# Patient Record
Sex: Male | Born: 1955 | Race: White | Hispanic: No | Marital: Married | Smoking: Former smoker
Health system: Southern US, Community
[De-identification: ages and names within clinical notes are randomized; demographics above are authoritative.]

## PROBLEM LIST (undated history)

## (undated) DIAGNOSIS — F329 Major depressive disorder, single episode, unspecified: Secondary | ICD-10-CM

## (undated) DIAGNOSIS — F32A Depression, unspecified: Secondary | ICD-10-CM

## (undated) DIAGNOSIS — R066 Hiccough: Secondary | ICD-10-CM

## (undated) DIAGNOSIS — F419 Anxiety disorder, unspecified: Secondary | ICD-10-CM

## (undated) DIAGNOSIS — D496 Neoplasm of unspecified behavior of brain: Secondary | ICD-10-CM

## (undated) DIAGNOSIS — D494 Neoplasm of unspecified behavior of bladder: Secondary | ICD-10-CM

## (undated) DIAGNOSIS — D649 Anemia, unspecified: Secondary | ICD-10-CM

## (undated) DIAGNOSIS — Z973 Presence of spectacles and contact lenses: Secondary | ICD-10-CM

## (undated) DIAGNOSIS — M1990 Unspecified osteoarthritis, unspecified site: Secondary | ICD-10-CM

## (undated) DIAGNOSIS — R351 Nocturia: Secondary | ICD-10-CM

## (undated) DIAGNOSIS — C801 Malignant (primary) neoplasm, unspecified: Secondary | ICD-10-CM

## (undated) DIAGNOSIS — Z9289 Personal history of other medical treatment: Secondary | ICD-10-CM

## (undated) HISTORY — PX: BACK SURGERY: SHX140

---

## 1992-03-19 HISTORY — PX: INGUINAL HERNIA REPAIR: SUR1180

## 2012-10-20 ENCOUNTER — Ambulatory Visit: Payer: Self-pay | Admitting: Internal Medicine

## 2013-01-17 HISTORY — PX: LUMBAR DISC SURGERY: SHX700

## 2013-02-09 ENCOUNTER — Other Ambulatory Visit: Payer: Self-pay | Admitting: Urology

## 2013-02-23 ENCOUNTER — Encounter (HOSPITAL_BASED_OUTPATIENT_CLINIC_OR_DEPARTMENT_OTHER): Payer: Self-pay | Admitting: *Deleted

## 2013-02-23 NOTE — Progress Notes (Signed)
NPO AFTER MN. ARRIVE AT 0700. NEEDS HG AND URINE CULTURE.

## 2013-02-24 NOTE — Anesthesia Preprocedure Evaluation (Signed)
Anesthesia Evaluation  Patient identified by MRN, date of birth, ID band Patient awake    Reviewed: Allergy & Precautions, H&P , NPO status , Patient's Chart, lab work & pertinent test results  Airway Mallampati: II TM Distance: >3 FB Neck ROM: Full    Dental no notable dental hx.    Pulmonary neg pulmonary ROS, former smoker,  breath sounds clear to auscultation  Pulmonary exam normal       Cardiovascular negative cardio ROS  Rhythm:Regular Rate:Normal     Neuro/Psych negative neurological ROS  negative psych ROS   GI/Hepatic negative GI ROS, Neg liver ROS,   Endo/Other  negative endocrine ROS  Renal/GU negative Renal ROS  negative genitourinary   Musculoskeletal negative musculoskeletal ROS (+)   Abdominal   Peds negative pediatric ROS (+)  Hematology negative hematology ROS (+)   Anesthesia Other Findings   Reproductive/Obstetrics negative OB ROS                           Anesthesia Physical Anesthesia Plan  ASA: II  Anesthesia Plan: General   Post-op Pain Management:    Induction: Intravenous  Airway Management Planned: LMA  Additional Equipment:   Intra-op Plan:   Post-operative Plan:   Informed Consent: I have reviewed the patients History and Physical, chart, labs and discussed the procedure including the risks, benefits and alternatives for the proposed anesthesia with the patient or authorized representative who has indicated his/her understanding and acceptance.   Dental advisory given  Plan Discussed with: CRNA and Surgeon  Anesthesia Plan Comments:         Anesthesia Quick Evaluation  

## 2013-02-25 ENCOUNTER — Encounter (HOSPITAL_BASED_OUTPATIENT_CLINIC_OR_DEPARTMENT_OTHER): Payer: Self-pay | Admitting: *Deleted

## 2013-02-25 ENCOUNTER — Ambulatory Visit (HOSPITAL_BASED_OUTPATIENT_CLINIC_OR_DEPARTMENT_OTHER)
Admission: RE | Admit: 2013-02-25 | Discharge: 2013-02-25 | Disposition: A | Discharge status: Home / Self Care | Payer: BC Managed Care – PPO | Source: Ambulatory Visit | Attending: Urology | Admitting: Urology

## 2013-02-25 ENCOUNTER — Encounter (HOSPITAL_BASED_OUTPATIENT_CLINIC_OR_DEPARTMENT_OTHER)
Admission: RE | Disposition: A | Discharge status: Home / Self Care | Payer: Self-pay | Source: Ambulatory Visit | Attending: Urology

## 2013-02-25 ENCOUNTER — Ambulatory Visit (HOSPITAL_BASED_OUTPATIENT_CLINIC_OR_DEPARTMENT_OTHER): Payer: BC Managed Care – PPO | Admitting: Anesthesiology

## 2013-02-25 ENCOUNTER — Encounter (HOSPITAL_BASED_OUTPATIENT_CLINIC_OR_DEPARTMENT_OTHER): Payer: BC Managed Care – PPO | Admitting: Anesthesiology

## 2013-02-25 DIAGNOSIS — Z7982 Long term (current) use of aspirin: Secondary | ICD-10-CM | POA: Insufficient documentation

## 2013-02-25 DIAGNOSIS — C679 Malignant neoplasm of bladder, unspecified: Secondary | ICD-10-CM | POA: Insufficient documentation

## 2013-02-25 DIAGNOSIS — E7800 Pure hypercholesterolemia, unspecified: Secondary | ICD-10-CM | POA: Insufficient documentation

## 2013-02-25 DIAGNOSIS — Z79899 Other long term (current) drug therapy: Secondary | ICD-10-CM | POA: Insufficient documentation

## 2013-02-25 DIAGNOSIS — Z87891 Personal history of nicotine dependence: Secondary | ICD-10-CM | POA: Insufficient documentation

## 2013-02-25 HISTORY — PX: CYSTOSCOPY W/ RETROGRADES: SHX1426

## 2013-02-25 HISTORY — PX: TRANSURETHRAL RESECTION OF BLADDER TUMOR: SHX2575

## 2013-02-25 HISTORY — DX: Presence of spectacles and contact lenses: Z97.3

## 2013-02-25 HISTORY — DX: Nocturia: R35.1

## 2013-02-25 HISTORY — DX: Neoplasm of unspecified behavior of bladder: D49.4

## 2013-02-25 HISTORY — DX: Unspecified osteoarthritis, unspecified site: M19.90

## 2013-02-25 SURGERY — TURBT (TRANSURETHRAL RESECTION OF BLADDER TUMOR)
Anesthesia: General

## 2013-02-25 MED ORDER — ACETAMINOPHEN 10 MG/ML IV SOLN
INTRAVENOUS | Status: DC | PRN
  Administered 2013-02-25: 1000 mg via INTRAVENOUS

## 2013-02-25 MED ORDER — DOCUSATE SODIUM 100 MG PO CAPS: 100.0000 mg | ORAL_CAPSULE | Freq: Two times a day (BID) | ORAL | Status: DC | PRN

## 2013-02-25 MED ORDER — LIDOCAINE HCL 1 % IJ SOLN
INTRAMUSCULAR | Status: AC
  Filled 2013-02-25: qty 20

## 2013-02-25 MED ORDER — HYDROCODONE-ACETAMINOPHEN 5-325 MG PO TABS: 1.0000 | ORAL_TABLET | Freq: Four times a day (QID) | ORAL | Status: DC | PRN

## 2013-02-25 MED ORDER — FENTANYL CITRATE 0.05 MG/ML IJ SOLN
INTRAMUSCULAR | Status: AC
  Filled 2013-02-25: qty 4

## 2013-02-25 MED ORDER — CIPROFLOXACIN IN D5W 400 MG/200ML IV SOLN
INTRAVENOUS | Status: AC
  Filled 2013-02-25: qty 200

## 2013-02-25 MED ORDER — ONDANSETRON HCL 4 MG/2ML IJ SOLN
INTRAMUSCULAR | Status: DC | PRN
  Administered 2013-02-25: 4 mg via INTRAVENOUS

## 2013-02-25 MED ORDER — LIDOCAINE HCL 2 % EX GEL
CUTANEOUS | Status: DC | PRN
  Administered 2013-02-25: 1 via URETHRAL

## 2013-02-25 MED ORDER — LACTATED RINGERS IV SOLN
INTRAVENOUS | Status: DC
  Administered 2013-02-25 (×2): via INTRAVENOUS
  Filled 2013-02-25: qty 1000

## 2013-02-25 MED ORDER — DEXAMETHASONE SODIUM PHOSPHATE 4 MG/ML IJ SOLN
INTRAMUSCULAR | Status: DC | PRN
  Administered 2013-02-25: 10 mg via INTRAVENOUS

## 2013-02-25 MED ORDER — MIDAZOLAM HCL 2 MG/2ML IJ SOLN
INTRAMUSCULAR | Status: AC
  Filled 2013-02-25: qty 2

## 2013-02-25 MED ORDER — SODIUM CHLORIDE 0.9 % IR SOLN
Status: DC | PRN
  Administered 2013-02-25: 21000 mL via INTRAVESICAL

## 2013-02-25 MED ORDER — PROPOFOL 10 MG/ML IV BOLUS
INTRAVENOUS | Status: DC | PRN
  Administered 2013-02-25: 200 mg via INTRAVENOUS
  Administered 2013-02-25: 50 mg via INTRAVENOUS

## 2013-02-25 MED ORDER — CIPROFLOXACIN IN D5W 400 MG/200ML IV SOLN
400.0000 mg | INTRAVENOUS | Status: AC
  Administered 2013-02-25: 400 mg via INTRAVENOUS
  Filled 2013-02-25: qty 200

## 2013-02-25 MED ORDER — PROMETHAZINE HCL 25 MG/ML IJ SOLN
6.2500 mg | INTRAMUSCULAR | Status: DC | PRN
  Filled 2013-02-25: qty 1

## 2013-02-25 MED ORDER — IOHEXOL 350 MG/ML SOLN
INTRAVENOUS | Status: DC | PRN
  Administered 2013-02-25: 20 mL via URETHRAL

## 2013-02-25 MED ORDER — HYDROMORPHONE HCL PF 1 MG/ML IJ SOLN
0.2500 mg | INTRAMUSCULAR | Status: DC | PRN
  Filled 2013-02-25: qty 1

## 2013-02-25 MED ORDER — FENTANYL CITRATE 0.05 MG/ML IJ SOLN
INTRAMUSCULAR | Status: DC | PRN
  Administered 2013-02-25 (×4): 50 ug via INTRAVENOUS

## 2013-02-25 MED ORDER — MITOMYCIN CHEMO FOR BLADDER INSTILLATION 40 MG
40.0000 mg | Freq: Once | INTRAVENOUS | Status: DC
  Filled 2013-02-25: qty 40

## 2013-02-25 MED ORDER — KETOROLAC TROMETHAMINE 30 MG/ML IJ SOLN
15.0000 mg | Freq: Once | INTRAMUSCULAR | Status: AC | PRN
  Filled 2013-02-25: qty 1

## 2013-02-25 MED ORDER — LIDOCAINE HCL (CARDIAC) 20 MG/ML IV SOLN
INTRAVENOUS | Status: DC | PRN
  Administered 2013-02-25: 80 mg via INTRAVENOUS

## 2013-02-25 MED ORDER — MIDAZOLAM HCL 5 MG/5ML IJ SOLN
INTRAMUSCULAR | Status: DC | PRN
  Administered 2013-02-25: 2 mg via INTRAVENOUS

## 2013-02-25 SURGICAL SUPPLY — 39 items
ADAPTER CATH URET PLST 4-6FR (CATHETERS) IMPLANT
BAG DRAIN URO-CYSTO SKYTR STRL (DRAIN) ×3 IMPLANT
BAG URINE DRAINAGE (UROLOGICAL SUPPLIES) ×3 IMPLANT
BAG URO CATCHER STRL LF (DRAPE) ×3 IMPLANT
BASKET ZERO TIP NITINOL 2.4FR (BASKET) IMPLANT
BLADE SURG 15 STRL LF DISP TIS (BLADE) IMPLANT
BLADE SURG 15 STRL SS (BLADE)
CANISTER SUCT LVC 12 LTR MEDI- (MISCELLANEOUS) ×3 IMPLANT
CATH FOLEY 2WAY SLVR  5CC 18FR (CATHETERS) ×1
CATH FOLEY 2WAY SLVR 5CC 18FR (CATHETERS) ×2 IMPLANT
CATH FOLEY 3WAY 30CC 22FR (CATHETERS) IMPLANT
CATH INTERMIT  6FR 70CM (CATHETERS) IMPLANT
CATH URET 5FR 28IN CONE TIP (BALLOONS)
CATH URET 5FR 28IN OPEN ENDED (CATHETERS) ×3 IMPLANT
CATH URET 5FR 70CM CONE TIP (BALLOONS) IMPLANT
CATH URET DUAL LUMEN 6-10FR 50 (CATHETERS) IMPLANT
CLOTH BEACON ORANGE TIMEOUT ST (SAFETY) ×3 IMPLANT
DRAPE CAMERA CLOSED 9X96 (DRAPES) ×3 IMPLANT
DRSG TEGADERM 2-3/8X2-3/4 SM (GAUZE/BANDAGES/DRESSINGS) IMPLANT
ELECT LOOP MED HF 24F 12D CBL (CLIP) ×3 IMPLANT
ELECT REM PT RETURN 9FT ADLT (ELECTROSURGICAL)
ELECTRODE REM PT RTRN 9FT ADLT (ELECTROSURGICAL) IMPLANT
EVACUATOR MICROVAS BLADDER (UROLOGICAL SUPPLIES) IMPLANT
GLOVE BIO SURGEON STRL SZ7.5 (GLOVE) ×3 IMPLANT
GLOVE BIO SURGEON STRL SZ8 (GLOVE) ×3 IMPLANT
GOWN PREVENTION PLUS XLARGE (GOWN DISPOSABLE) ×3 IMPLANT
GOWN STRL REIN XL XLG (GOWN DISPOSABLE) ×3 IMPLANT
GUIDEWIRE 0.038 PTFE COATED (WIRE) IMPLANT
GUIDEWIRE ANG ZIPWIRE 038X150 (WIRE) ×3 IMPLANT
GUIDEWIRE STR DUAL SENSOR (WIRE) IMPLANT
HOLDER FOLEY CATH W/STRAP (MISCELLANEOUS) IMPLANT
IV NS IRRIG 3000ML ARTHROMATIC (IV SOLUTION) ×12 IMPLANT
KIT ASPIRATION TUBING (SET/KITS/TRAYS/PACK) ×3 IMPLANT
NS IRRIG 500ML POUR BTL (IV SOLUTION) IMPLANT
PACK CYSTOSCOPY (CUSTOM PROCEDURE TRAY) ×3 IMPLANT
SUT ETHILON 3 0 PS 1 (SUTURE) IMPLANT
SYR 30ML LL (SYRINGE) ×3 IMPLANT
SYRINGE IRR TOOMEY STRL 70CC (SYRINGE) ×3 IMPLANT
TUBE FEEDING 8FR 16IN STR KANG (MISCELLANEOUS) IMPLANT

## 2013-02-25 NOTE — Op Note (Signed)
Preoperative diagnosis:  1. Low-grade appearing papillary lesion on the posterior wall the bladder   Postoperative diagnosis:  1. 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 TURBT 2-5 cm 2. Bilateral retrograde pyelograms with interpretation 3. Exam under anesthesia  Surgeon: Crist Fat, MD  Anesthesia: General  Complications: None  Intraoperative findings: Patient has a large obstructing prostate with a high median bar, UOs were nearly vertical on the posterior side of the median lobe of the prostate, there were no filling defects noted in either ureter or renal pelvis bilaterally. Normal retrograde pyelograms. There were 2 tumors on a broad base the posterior aspect of the bladder each measuring 1-2 cm. Resection was difficult due to the size of the patient's bladder, the scope was was nearly not long enough to get to the lesion. The resection was performed with a half filled bladder. Given concern for invasion into the lamina propria and the high-grade appearance of the tumor no mitomycin was given. Exam under anesthesia revealed a mobile bladder with no palpable mass. Prostate was enlarged but symmetric and smooth.  EBL: Minimal  Specimens: Bladder tumor, both lesions were sent together as one specimen. Base of bladder tumor was sent as a separate specimen. These were sent to the pathology lab for permanent specimens  Indication: Erik Gross is a 57 y.o. patient with microscopic hematuria and dysuria. Workup ultimately revealed a bladder tumor.  After reviewing the management options for treatment, he elected to proceed with the above surgical procedure(s). We have discussed the potential benefits and risks of the procedure, side effects of the proposed treatment, the likelihood of the patient achieving the goals of the procedure, and any potential problems that might occur  during the procedure or recuperation. Informed consent has been obtained.  Description of procedure:  The patient was taken to the operating room and general anesthesia was induced.  The patient was placed in the dorsal lithotomy position, prepped and draped in the usual sterile fashion, and preoperative antibiotics were administered. A preoperative time-out was performed.   A 12.5 22 French rigid cystoscope was then gently passed to the patient's urethra and into the bladder under visual guidance. The scope was then exchanged for the 70 lens and a 360 cystoscopic evaluation was performed with the above findings. The scope was then removed and the 26 French resectoscope sheath was passed through the bladder using a 30 lens with the visual obturator. The obturator was then removed and exchanged for the bipolar resectoscope loop. The lesions were then systematically resected in the usual fashion. Again, this was difficult given the location of the tumor being far posterior incise the patient's bladder. Significant suprapubic pressure was required to facilitate the resection. Once both lesions had been fully resected the tumor was evacuated using a Ellik syringe. The base of the tumors were then  resected and sent as separate specimen. Hemostasis was then achieved.  The resectoscope was then removed and the the 22 French cystoscope was then gently repassed through the urethra and into the bladder. Using a 5 [place] open-ended ureteral Pollock catheter bilateral retrograde pyelogram were performed in the usual fashion. The findings are as above. The bladder was then emptied and the cystoscope removed.  A bimanual exam was then performed with the above findings.  An 45 French Foley catheter was then gently passed to the patient's urethra and into the bladder. 10  cc of sterile water was inserted the balloon. Blood-tinged effluent was noted, the urine was pink.  Incision of this point not to give  mitomycin was made given the high-grade appearance of the patient's tumor and the depth of the resection. The patient was subsequently awoken and returned to PACU in stable condition.  Disposition: The patient will recover the PACU, the Foley catheter be removed. The patient will then be discharged home.  Crist Fat, M.D.

## 2013-02-25 NOTE — Transfer of Care (Signed)
Immediate Anesthesia Transfer of Care Note  Patient: Erik Gross  Procedure(s) Performed: Procedure(s): TRANSURETHRAL RESECTION OF BLADDER TUMOR (TURBT) WITH POST OP MITOMYCIN-C (N/A) BILATERAL RETROGRADE PYELOGRAM (Bilateral)  Patient Location: PACU  Anesthesia Type:General  Level of Consciousness: awake, alert  and oriented  Airway & Oxygen Therapy: Patient Spontanous Breathing and Patient connected to nasal cannula oxygen  Post-op Assessment: Report given to PACU RN  Post vital signs: Reviewed and stable  Complications: No apparent anesthesia complications

## 2013-02-25 NOTE — Anesthesia Postprocedure Evaluation (Signed)
  Anesthesia Post-op Note  Patient: Erik Gross  Procedure(s) Performed: Procedure(s) (LRB): TRANSURETHRAL RESECTION OF BLADDER TUMOR (TURBT) WITH POST OP MITOMYCIN-C (N/A) BILATERAL RETROGRADE PYELOGRAM (Bilateral)  Patient Location: PACU  Anesthesia Type: General  Level of Consciousness: awake and alert   Airway and Oxygen Therapy: Patient Spontanous Breathing  Post-op Pain: mild  Post-op Assessment: Post-op Vital signs reviewed, Patient's Cardiovascular Status Stable, Respiratory Function Stable, Patent Airway and No signs of Nausea or vomiting  Last Vitals:  Filed Vitals:   02/25/13 1016  BP: 115/85  Pulse: 88  Temp: 36.5 C  Resp: 16    Post-op Vital Signs: stable   Complications: No apparent anesthesia complications

## 2013-02-25 NOTE — Anesthesia Procedure Notes (Signed)
Procedure Name: LMA Insertion Date/Time: 02/25/2013 8:37 AM Performed by: Maris Berger T Pre-anesthesia Checklist: Patient identified, Emergency Drugs available, Suction available and Patient being monitored Patient Re-evaluated:Patient Re-evaluated prior to inductionOxygen Delivery Method: Circle System Utilized Preoxygenation: Pre-oxygenation with 100% oxygen Intubation Type: IV induction Ventilation: Mask ventilation without difficulty LMA: LMA inserted LMA Size: 5.0 Number of attempts: 1 Airway Equipment and Method: bite block Placement Confirmation: positive ETCO2 Dental Injury: Teeth and Oropharynx as per pre-operative assessment

## 2013-02-25 NOTE — H&P (Signed)
History of Present Illness This is a 57 year old gentleman who was referred by Dr. Bethann Punches, M.D. for evaluation and management of incomplete emptying and hematuria.  GU HX: The patient states that he has had this problem for several years but it has progressively gotten worse over the last 12 months. The patient states that he often void prior to going to bed and then almost immediately feels the urge to void again. In addition the patient has had gross hematuria since December intermittently. He treats this with apple cider vinegar and baking soda which seems to clear it up. No history of documented UTIs but does have dysuria associated with this gross hematuria. The patient denies any flank pain, fever or suprapubic pain. He does endorse a weak stream and terminal dribbling. He denies urinary urgency, frequency or any urinary incontinence. The patient denies a history of kidney stones.   Was given Flomax, Cipro (although culture was negative), and taught how to double void.  Interval:  Patient returns today for clearance for his upcoming back surgery. Urinalysis yesterday for his preoperative evaluation revealed many red blood cells, white blood cells, and bacteria. The patient states that he seems to be voiding better with the initiation of Flomax, but he still has dysuria. He denies any gross hematuria, but does say that his urine was somewhat darker today.  Patient has no known history of recent viral infections, has no known exposures to organic compounds or industrial solvents,  no history of kidney stones, and has no family history of GU malignancy.   Past Medical History Problems  1. History of Arthritis (V13.4) 2. History of hypercholesterolemia (V12.29)  Surgical History Problems  1. History of Hernia Repair  Current Meds 1. Apple Cider Vinegar CAPS;  Therapy: (Recorded:14Oct2014) to Recorded 2. Aspirin 81 MG Oral Tablet;  Therapy: (Recorded:14Oct2014) to Recorded 3.  Ciprofloxacin HCl - 500 MG Oral Tablet; TAKE 1 TABLET Every twelve hours;  Therapy: 14Oct2014 to (Evaluate:21Oct2014)  Requested for: 14Oct2014; Last  Rx:14Oct2014 Ordered 4. Multi-Vitamin TABS;  Therapy: (Recorded:14Oct2014) to Recorded 5. Tamsulosin HCl - 0.4 MG Oral Capsule; TAKE 1 CAPSULE Daily;  Therapy: 14Oct2014 to (Evaluate:12May2015)  Requested for: 14Oct2014; Last  Rx:14Oct2014 Ordered  Allergies Medication  1. No Known Drug Allergies  Family History Problems  1. Family history of Death In The Family Father : Mother   age 28 heart attack 2. Family history of Death In The Family Mother : Mother   age 5 lung cancer 3. Family history of Family Health Status Number Of Children : Mother   1 son 1 daughter 4. Family history of Heart Disease (V17.49) : Father 5. Family history of Lung Cancer (V16.1) : Mother  Social History Problems  1. Denied: History of Alcohol Use 2. Caffeine Use   6-8 per day 3. Former smoker (V15.82)   2 ppd for 32 years quit since 2007 4. Marital History - Currently Married 5. Occupation:   supply room clerk  Review of Systems No changes in the patient's neurologic status, or change in bowel habits. He denies any weight loss, night sweats, or fatigue. He has ongoing back pain.   Vitals Vital Signs [Data Includes: Last 1 Day]  Recorded: 04Nov2014 08:34AM  Blood Pressure: 126 / 76 Temperature: 97.6 F Heart Rate: 92  Physical Exam Constitutional: Well nourished and well developed . No acute distress.  ENT:. The ears and nose are normal in appearance.  Neck: The appearance of the neck is normal and no neck mass is  present.  Abdomen: The abdomen is soft and nontender. No masses are palpated. No CVA tenderness. No hernias are palpable. No hepatosplenomegaly noted.  Genitourinary: Examination of the penis demonstrates no discharge, no masses, no lesions and a normal meatus. The scrotum is without lesions. The right epididymis is palpably  normal and non-tender. The left epididymis is palpably normal and non-tender. The right testis is non-tender and without masses. The left testis is non-tender and without masses.  Skin: Normal skin turgor, no visible rash and no visible skin lesions.  Neuro/Psych:. Mood and affect are appropriate.    Results/Data Urine [Data Includes: Last 1 Day]   04Nov2014  COLOR AMBER   APPEARANCE CLOUDY   SPECIFIC GRAVITY 1.010   pH 7.5   GLUCOSE NEG mg/dL  BILIRUBIN NEG   KETONE NEG mg/dL  BLOOD LARGE   PROTEIN TRACE mg/dL  UROBILINOGEN 0.2 mg/dL  NITRITE NEG   LEUKOCYTE ESTERASE NEG   SQUAMOUS EPITHELIAL/HPF RARE   WBC 0-2 WBC/hpf  RBC TNTC RBC/hpf  BACTERIA RARE   CRYSTALS NONE SEEN   CASTS NONE SEEN    Procedure  Procedure: Cystoscopy  Chaperone Present: amanda.  Indication: Hematuria.  Informed Consent: Risks, benefits, and potential adverse events were discussed and informed consent was obtained from the patient.  Prep: The patient was prepped with hibiclens.  Antibiotic prophylaxis: Ciprofloxacin.  Procedure Note:  Urethral meatus:. No abnormalities.  Anterior urethra: No abnormalities.  Prostatic urethra: No abnormalities.  Bladder: Visulization was obscured due to cloudy urine. The ureteral orifices were in the normal anatomic position bilaterally. A solitary tumor was visualized in the bladder. This tumor was located on the right side, on the posterior aspect, at the base of the bladder. The patient tolerated the procedure well.  Complications: None.    Assessment Microscopic hematuria most likely secondary to the low-grade appearing less than 2 cm bladder lesion however, to be thorough will also evaluate the kidneys and ureters via CT scan..   End of Encounter Meds  Medication Name Instruction  Apple Cider Vinegar CAPS   Aspirin 81 MG Oral Tablet   Multi-Vitamin TABS   Tamsulosin HCl - 0.4 MG Oral Capsule TAKE 1 CAPSULE Daily   Plan  Bladder mass  1. AU  CT-HEMATURIA PROTOCOL; Status:Hold For - Appointment,PreCert,Date of  Service,Print; Requested for:04Nov2014;  2. BASIC METABOLIC PANEL; Status:In Progress - Specimen/Data Collected;   Done:  04Nov2014 3. Cysto; Status:Complete;   Done: 04Nov2014 4. VENIPUNCTURE; Status:Complete;   Done: 04Nov2014 5. Follow-up Schedule Surgery Office  Follow-up  Status: Complete  Done: 04Nov2014 Health Maintenance  6. UA With REFLEX; Status:Complete;   Done: 04Nov2014 08:23AM  URINE CULTURE; Status:In Progress - Specimen/Data Collected;  Done: 04Nov2014 Perform:[facility]; Due:06Nov2014; Marked Important; Last Updated ZO:[id number], Marthenia Rolling; 01/20/2013 10:12:27 AM;Ordered; Today;  WUJ:[id number] mass; Ordered WG:[id number], Sharlet [place];   Discussion/Summary Prior to undergoing cystoscopy, I did call Dr. Lorelee Market office and discussed his microscopic hematuria with his nurse. There is no evidence of infection in his urine today however the hematuria was concerning and I felt that this needed to be evaluated. However, the patient was interested in having his back surgery first and then evaluating microscopic hematuria. After conferring with Dr. Clotilde Dieter, the decision was made to proceed with the hematuria evaluation. This did reveal small mass in the posterior wall of the patient's bladder. I have scheduled a transurethral resection of this bladder tumor, and we will plan to give the patient postoperative mitomycin C in the PACU. I suspect the patient will be  ready for his back surgery in 4 weeks.    Addendum: Heart sounds normal, regular rate/rhythm Lungs sounds clear bilaterally  No changes to above. Proceed with TURBT, b/l retrograde pyelograms, post-operative [facility].

## 2013-02-26 ENCOUNTER — Encounter (HOSPITAL_BASED_OUTPATIENT_CLINIC_OR_DEPARTMENT_OTHER): Payer: Self-pay | Admitting: Urology

## 2013-02-26 LAB — URINE CULTURE

## 2013-03-10 ENCOUNTER — Telehealth: Payer: Self-pay | Admitting: Oncology

## 2013-03-10 NOTE — Telephone Encounter (Signed)
LVOM FOR PT TO RETURN CALL IN RE TO REFERRAL.  °

## 2013-03-11 ENCOUNTER — Telehealth: Payer: Self-pay | Admitting: Oncology

## 2013-03-11 NOTE — Telephone Encounter (Signed)
C/D 03/11/13  For appt. 03/18/13

## 2013-03-11 NOTE — Telephone Encounter (Signed)
SW PT AND GVE NP APPT 12/31 @ 1:30 W/DR. SHADAD REFERRING DR. HERRICK DX-BLADDER CA WELCOME PACKET MAILED.

## 2013-03-11 NOTE — Telephone Encounter (Signed)
S/W PT AND GVE NP APPT 12/26 @ 12:30 W/DR. LIVESAY REFERRING DR. MATT MCDONALD WELCOME PACKET MAILED.

## 2013-03-11 NOTE — Telephone Encounter (Signed)
**  ERROR IN 1ST MESSAGE**

## 2013-03-18 ENCOUNTER — Other Ambulatory Visit: Payer: BC Managed Care – PPO

## 2013-03-18 ENCOUNTER — Ambulatory Visit: Payer: BC Managed Care – PPO | Admitting: Oncology

## 2013-03-18 ENCOUNTER — Ambulatory Visit: Payer: BC Managed Care – PPO

## 2013-12-14 ENCOUNTER — Inpatient Hospital Stay (HOSPITAL_COMMUNITY)
Admission: EM | Admit: 2013-12-14 | Discharge: 2013-12-17 | DRG: 669 | Disposition: A | Discharge status: Home / Self Care | Payer: BC Managed Care – PPO | Attending: Internal Medicine | Admitting: Internal Medicine

## 2013-12-14 ENCOUNTER — Emergency Department (HOSPITAL_COMMUNITY): Payer: BC Managed Care – PPO

## 2013-12-14 ENCOUNTER — Encounter (HOSPITAL_COMMUNITY): Payer: Self-pay | Admitting: Emergency Medicine

## 2013-12-14 DIAGNOSIS — E785 Hyperlipidemia, unspecified: Secondary | ICD-10-CM | POA: Diagnosis present

## 2013-12-14 DIAGNOSIS — Z8551 Personal history of malignant neoplasm of bladder: Secondary | ICD-10-CM

## 2013-12-14 DIAGNOSIS — Z7982 Long term (current) use of aspirin: Secondary | ICD-10-CM

## 2013-12-14 DIAGNOSIS — C672 Malignant neoplasm of lateral wall of bladder: Principal | ICD-10-CM | POA: Diagnosis present

## 2013-12-14 DIAGNOSIS — Z66 Do not resuscitate: Secondary | ICD-10-CM | POA: Diagnosis present

## 2013-12-14 DIAGNOSIS — D494 Neoplasm of unspecified behavior of bladder: Secondary | ICD-10-CM

## 2013-12-14 DIAGNOSIS — Z79899 Other long term (current) drug therapy: Secondary | ICD-10-CM | POA: Diagnosis not present

## 2013-12-14 DIAGNOSIS — D62 Acute posthemorrhagic anemia: Secondary | ICD-10-CM | POA: Diagnosis present

## 2013-12-14 DIAGNOSIS — D500 Iron deficiency anemia secondary to blood loss (chronic): Secondary | ICD-10-CM | POA: Diagnosis present

## 2013-12-14 DIAGNOSIS — Z87891 Personal history of nicotine dependence: Secondary | ICD-10-CM | POA: Diagnosis not present

## 2013-12-14 DIAGNOSIS — C675 Malignant neoplasm of bladder neck: Secondary | ICD-10-CM

## 2013-12-14 DIAGNOSIS — R319 Hematuria, unspecified: Secondary | ICD-10-CM | POA: Diagnosis present

## 2013-12-14 DIAGNOSIS — D649 Anemia, unspecified: Secondary | ICD-10-CM | POA: Diagnosis present

## 2013-12-14 HISTORY — DX: Malignant (primary) neoplasm, unspecified: C80.1

## 2013-12-14 LAB — ABO/RH: ABO/RH(D): O POS

## 2013-12-14 LAB — CBC
HCT: 18.3 % — ABNORMAL LOW (ref 39.0–52.0)
Hemoglobin: 5.5 g/dL — CL (ref 13.0–17.0)
MCH: 22.4 pg — ABNORMAL LOW (ref 26.0–34.0)
MCHC: 30.1 g/dL (ref 30.0–36.0)
MCV: 74.4 fL — AB (ref 78.0–100.0)
PLATELETS: 282 10*3/uL (ref 150–400)
RBC: 2.46 MIL/uL — AB (ref 4.22–5.81)
RDW: 18.8 % — ABNORMAL HIGH (ref 11.5–15.5)
WBC: 7.7 10*3/uL (ref 4.0–10.5)

## 2013-12-14 LAB — URINALYSIS, ROUTINE W REFLEX MICROSCOPIC
BILIRUBIN URINE: NEGATIVE
GLUCOSE, UA: NEGATIVE mg/dL
Ketones, ur: 15 mg/dL — AB
NITRITE: NEGATIVE
PH: 5.5 (ref 5.0–8.0)
Protein, ur: >300 mg/dL — AB
SPECIFIC GRAVITY, URINE: 1.027 (ref 1.005–1.030)
Urobilinogen, UA: 1 mg/dL (ref 0.0–1.0)

## 2013-12-14 LAB — COMPREHENSIVE METABOLIC PANEL
ALT: 8 U/L (ref 0–53)
AST: 12 U/L (ref 0–37)
Albumin: 3.4 g/dL — ABNORMAL LOW (ref 3.5–5.2)
Alkaline Phosphatase: 53 U/L (ref 39–117)
Anion gap: 13 (ref 5–15)
BUN: 12 mg/dL (ref 6–23)
CO2: 23 meq/L (ref 19–32)
CREATININE: 1.11 mg/dL (ref 0.50–1.35)
Calcium: 8.5 mg/dL (ref 8.4–10.5)
Chloride: 100 mEq/L (ref 96–112)
GFR calc Af Amer: 82 mL/min — ABNORMAL LOW (ref >90)
GFR, EST NON AFRICAN AMERICAN: 70 mL/min — AB (ref >90)
Glucose, Bld: 99 mg/dL (ref 70–99)
Potassium: 3.7 mEq/L (ref 3.7–5.3)
Sodium: 136 mEq/L — ABNORMAL LOW (ref 137–147)
TOTAL PROTEIN: 6.7 g/dL (ref 6.0–8.3)
Total Bilirubin: 0.2 mg/dL — ABNORMAL LOW (ref 0.3–1.2)

## 2013-12-14 LAB — URINE MICROSCOPIC-ADD ON

## 2013-12-14 LAB — PREPARE RBC (CROSSMATCH)

## 2013-12-14 MED ORDER — ONDANSETRON HCL 4 MG PO TABS: 4.0000 mg | ORAL_TABLET | Freq: Four times a day (QID) | ORAL | Status: DC | PRN

## 2013-12-14 MED ORDER — DIPHENHYDRAMINE HCL 50 MG/ML IJ SOLN
25.0000 mg | Freq: Once | INTRAMUSCULAR | Status: DC
Start: 2013-12-14 — End: 2013-12-14

## 2013-12-14 MED ORDER — ACETAMINOPHEN 325 MG PO TABS: 650.0000 mg | ORAL_TABLET | Freq: Four times a day (QID) | ORAL | Status: DC | PRN

## 2013-12-14 MED ORDER — IOHEXOL 300 MG/ML  SOLN
80.0000 mL | Freq: Once | INTRAMUSCULAR | Status: AC | PRN
  Administered 2013-12-14: 80 mL via INTRAVENOUS

## 2013-12-14 MED ORDER — ONDANSETRON HCL 4 MG/2ML IJ SOLN: 4.0000 mg | Freq: Four times a day (QID) | INTRAMUSCULAR | Status: DC | PRN

## 2013-12-14 MED ORDER — METOCLOPRAMIDE HCL 5 MG/ML IJ SOLN: 10.0000 mg | Freq: Once | INTRAMUSCULAR | Status: DC

## 2013-12-14 MED ORDER — SODIUM CHLORIDE 0.9 % IV SOLN
Freq: Once | INTRAVENOUS | Status: AC
  Administered 2013-12-14: 17:00:00 via INTRAVENOUS

## 2013-12-14 MED ORDER — ONDANSETRON HCL 4 MG/2ML IJ SOLN: 4.0000 mg | Freq: Three times a day (TID) | INTRAMUSCULAR | Status: DC | PRN

## 2013-12-14 MED ORDER — ACETAMINOPHEN 650 MG RE SUPP: 650.0000 mg | Freq: Four times a day (QID) | RECTAL | Status: DC | PRN

## 2013-12-14 MED ORDER — IOHEXOL 300 MG/ML  SOLN
25.0000 mL | Freq: Once | INTRAMUSCULAR | Status: AC | PRN
  Administered 2013-12-14: 25 mL via ORAL

## 2013-12-14 NOTE — Consult Note (Signed)
Urology Consult   Physician requesting consult: Dr. Noemi Chapel, ED  Reason for consult: Bladder cancer  History of Present Illness: Erik Gross is a 58 y.o. male with known muscle invasive bladder cancer diagnosed in 02/2013 after evaluation for gross hematuria and lower urinary tract symptoms. Retrograde pyelograms were negative at that time. The patient refused the recommendation for definitive treatment despite explaining that this is an invariably a progressive and lethal malignancy. He did not follow up with urology. He continued to have intermittent gross hematuria. He has been having worsening hematuria with clot passage recently over the last month. He has to strain to void due to the clots on occasion but is able to empty his bladder. He was sent to the ER after presenting to his PCP with DOE, weakness and fatigue. Labs showed significant anemia with a Hgb of 5.5.     Past Medical History  Diagnosis Date  . Bladder tumor   . Nocturia   . Arthritis   . Wears glasses   . Cancer     Past Surgical History  Procedure Laterality Date  . Lumbar disc surgery  01-17-2013  . Inguinal hernia repair Right 1994  . Transurethral resection of bladder tumor N/A 02/25/2013    Procedure: TRANSURETHRAL RESECTION OF BLADDER TUMOR (TURBT) WITH POST OP MITOMYCIN-C;  Surgeon: Ardis Hughs, MD;  Location: [facility];  Service: Urology;  Laterality: N/A;  . Cystoscopy w/ retrogrades Bilateral 02/25/2013    Procedure: BILATERAL RETROGRADE PYELOGRAM;  Surgeon: Ardis Hughs, MD;  Location: [facility];  Service: Urology;  Laterality: Bilateral;    Medications:  Home meds:    Medication List    ASK your doctor about these medications       aspirin EC 81 MG tablet  Take by mouth daily.     multivitamin tablet  Take 1 tablet by mouth daily.     TURMERIC PO  Take 1 tablet by mouth daily.        Scheduled Meds:  Continuous Infusions: . sodium  chloride     PRN Meds:.  Allergies: No Known Allergies  No family history on file.  Social History:  reports that he quit smoking about 9 years ago. His smoking use included Cigarettes. He has a 60 pack-year smoking history. He has never used smokeless tobacco. He reports that he does not drink alcohol or use illicit drugs.  ROS: A complete review of systems was performed.  All systems are negative except for pertinent findings as noted.  Physical Exam:  Vital signs in last 24 hours: Temp:  [98.2 F (36.8 C)] 98.2 F (36.8 C) (09/28 1357) Pulse Rate:  [94-108] 96 (09/28 1700) Resp:  [24-28] 28 (09/28 1700) BP: (99-131)/(57-72) 121/72 mmHg (09/28 1700) SpO2:  [99 %-100 %] 100 % (09/28 1700) Weight:  [89.812 kg (198 lb)] 89.812 kg (198 lb) (09/28 1357) Constitutional:  Alert and oriented, No acute distress Cardiovascular: Regular rate and rhythm, No JVD Respiratory: Normal respiratory effort, Lungs clear bilaterally GI: Abdomen is soft, nontender, nondistended, no abdominal masses Genitourinary: No CVAT. Normal male phallus, testes are descended bilaterally and non-tender and without masses, scrotum is normal in appearance without lesions or masses, perineum is normal on inspection. Lymphatic: No lymphadenopathy Neurologic: Grossly intact, no focal deficits Psychiatric: Normal mood and affect  Laboratory Data:   CBC Latest Ref Rng 12/14/2013 02/25/2013  WBC 4.0 - 10.5 K/uL 7.7 -  Hemoglobin 13.0 - 17.0 g/dL 5.5(LL) 14.9  Hematocrit 39.0 - 52.0 % 18.3(L) -  Platelets 150 - 400 K/uL 282 -     Recent Labs  12/14/13 1408  NA 136*  K 3.7  CL 100  GLUCOSE 99  BUN 12  CALCIUM 8.5  CREATININE 1.11    Urinalysis    Component Value Date/Time   COLORURINE RED* 12/14/2013 1511   APPEARANCEUR TURBID* 12/14/2013 1511   LABSPEC 1.027 12/14/2013 1511   PHURINE 5.5 12/14/2013 1511   GLUCOSEU NEGATIVE 12/14/2013 1511   HGBUR LARGE* 12/14/2013 1511   BILIRUBINUR NEGATIVE 12/14/2013  1511   KETONESUR 15* 12/14/2013 1511   PROTEINUR >300* 12/14/2013 1511   UROBILINOGEN 1.0 12/14/2013 1511   NITRITE NEGATIVE 12/14/2013 1511   LEUKOCYTESUR SMALL* 12/14/2013 1511       Radiologic Imaging: Ct Abdomen Pelvis W Contrast  12/14/2013   CLINICAL DATA:  Increasing hematuria over past couple weeks, shortness of breath, weakness, now with hemoglobin of 5 g, history of bladder cancer 1 year ago  EXAM: CT ABDOMEN AND PELVIS WITH CONTRAST  TECHNIQUE: Multidetector CT imaging of the abdomen and pelvis was performed using the standard protocol following bolus administration of intravenous contrast. Sagittal and coronal MPR images reconstructed from axial data set.  CONTRAST:  46mL OMNIPAQUE IOHEXOL 300 MG/ML SOLN IV. Dilute oral contrast.  COMPARISON:  None.  FINDINGS: Lung bases clear.  Multiple hepatic cysts largest 18 x 17 mm.  Tiny calcified granulomata within spleen.  Liver, spleen, pancreas, kidneys and adrenal glands otherwise normal appearance.  No hydronephrosis, ureteral calcification or ureteral dilatation.  Abnormal appearance of the urinary bladder with an enhancing irregular soft tissue mass at the LEFT lateral aspect measuring 5.3 x 4.0 x 5.6 cm in size.  This most likely represents a bladder neoplasm.  Infiltration of adjacent fat planes in prevesical space is concerning for direct tumor extension.  Tumor extends to near the LEFT ureterovesical junction without obstruction/ureteral dilatation.  Dependent intermediate attenuation within the bladder could represent a second tumor nodule or dependent blood clot 2.6 x 1.8 x 1.7 cm.  No definite pelvic adenopathy or perivesical tumor nodularity.  Prostate gland and seminal vesicles grossly unremarkable.  Mild hyperemia in LEFT perivesical soft tissues.  Normal appendix.  Scattered colonic diverticulosis greatest at sigmoid colon.  Stomach and bowel loops otherwise normal appearance.  No additional mass, adenopathy, free fluid, or free air.   Small umbilical hernia containing fat.  No definite osseous metastatic lesions.  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  IMPRESSION: 5.3 x 4.0 x 5.6 cm diameter irregular enhancing mass at the the LEFT lateral aspect of the urinary bladder compatible with bladder neoplasm with probable direct extension of tumor into the prevesical space.  Small amount of blood clot or less likely an additional tumor nodule dependently in bladder.  Bladder tumor extends to near the LEFT ureterovesical junction without causing LEFT hydronephrosis or ureteral dilatation.  No definite adenopathy or metastatic lesions.  Sigmoid diverticulosis without diverticulitis.  Hepatic cystic disease.  Umbilical hernia.  Findings called to Dr. Sabra Heck on 12/14/2013 at 1553 hours.   Electronically Signed   By: Lavonia Dana M.D.   On: 12/14/2013 15:54    I independently reviewed the above imaging studies. Enlarged mass since 02/2013 with likely extension into perivesical fat. No obvious adenopathy or metastatic disease.   Impression/Recommendation 57M with known T2 urothelial cancer of the bladder who previously refused any treatment and did not follow up after  recommendation for neo-adjuvant chemotherapy followed by radical cystectomy in 02/2013. He now presents with profound anemia, continued hematuria with passage of clot, and progression of his tumor (possibly now T3). No clear evidence of metastatic disease at this point, but work up not yet complete.   Discussed the current status of the patient's disease and that the recommended paradigm for what looks to be at least T3 disease would be neoadjuvant platinum based chemotherapy followed by radical cystectomy depending on his response. This is all assuming his chest imaging is negative. Despite refusing this in 02/2013, the patient now seems willing to entertain this idea and would like to speak to a medical oncologist.  Without chemotherapy, it is likely that radical cystectomy would not be curative and subject him to unnecessary harms of surgery without the benefit.   1. Discussed foley catheter given clot passage and intermittent difficulty urinating. Patient is currently refusing this as he is emptying his bladder reasonably well. 2. Agree with medicine admission for treatment of severe anemia. 3. Medical oncology referral 4. Complete staging with CT chest  Urology will follow along.    I have seen and examined the patient, reviewed his history and review of systems, viewed the CT images and reviewed his labs and prior AUS office notes.   I discussed the case with Dr. Sabra Heck, Dr. Wynetta Emery and Dr. Louis Meckel and concur with the assessment and plan.    CC: Dr. Noemi Chapel.

## 2013-12-14 NOTE — H&P (Signed)
[facility] History and Physical  Beni D Garabedian ZOX:[id number] DOB: 1955/10/04 DOA: 12/14/2013  Referring physician: EDP PCP: Rusty Aus., MD   Chief Complaint: fatigue  HPI: Erik Gross is a 58 y.o. male with PMH only significant for Bladder CA, diagnosed in 11/14, he was seen by Dr.Herrick and underwent TURBT in 12/14, subsequently advised to have further surgery and chemotherapy which he refused then and since has not followed up with Urology. Since June he has been having intermittent hematuria which used to clears spontaneously, for the last 3-4 weeks having gross bloody hematuria with clots with intermittent episodes of retention. For last few days with increasing fatigue and progressive Dyspnea on exertion, went to see her PCP found to be profoundly anemic and referred to the ER. In ER, Hb 5.5, gross hematuria and CT with locally advanced bladder CA   Review of Systems: positives bolded Constitutional:  No weight loss, night sweats, Fevers, chills, fatigue.  HEENT:  No headaches, Difficulty swallowing,Tooth/dental problems,Sore throat,  No sneezing, itching, ear ache, nasal congestion, post nasal drip,  Cardio-vascular:  No chest pain, Orthopnea, PND, swelling in lower extremities, anasarca, dizziness, palpitations  GI:  No heartburn, indigestion, abdominal pain, nausea, vomiting, diarrhea, change in bowel habits, loss of appetite  Resp:   shortness of breath with exertion or at rest. No excess mucus, no productive cough, No non-productive cough, No coughing up of blood.No change in color of mucus.No wheezing.No chest wall deformity  Skin:  no rash or lesions.  GU:  no dysuria, change in color of urine, no urgency or frequency. No flank pain.  Musculoskeletal:  No joint pain or swelling. No decreased range of motion. No back pain.  Psych:  No change in mood or affect. No depression or anxiety. No memory loss.   Past Medical History  Diagnosis Date  . Bladder  tumor   . Nocturia   . Arthritis   . Wears glasses   . Cancer    Past Surgical History  Procedure Laterality Date  . Lumbar disc surgery  01-17-2013  . Inguinal hernia repair Right 1994  . Transurethral resection of bladder tumor N/A 02/25/2013    Procedure: TRANSURETHRAL RESECTION OF BLADDER TUMOR (TURBT) WITH POST OP MITOMYCIN-C;  Surgeon: Ardis Hughs, MD;  Location: [facility];  Service: Urology;  Laterality: N/A;  . Cystoscopy w/ retrogrades Bilateral 02/25/2013    Procedure: BILATERAL RETROGRADE PYELOGRAM;  Surgeon: Ardis Hughs, MD;  Location: [facility];  Service: Urology;  Laterality: Bilateral;   Social History:  reports that he quit smoking about 9 years ago. His smoking use included Cigarettes. He has a 60 pack-year smoking history. He has never used smokeless tobacco. He reports that he does not drink alcohol or use illicit drugs.  No Known Allergies  No family history on file.   Prior to Admission medications   Medication Sig Start Date End Date Taking? Authorizing Provider  aspirin EC 81 MG tablet Take by mouth daily.    Yes Historical Provider, MD  Multiple Vitamin (MULTIVITAMIN) tablet Take 1 tablet by mouth daily.   Yes Historical Provider, MD  TURMERIC PO Take 1 tablet by mouth daily.   Yes Historical Provider, MD   Physical Exam: Filed Vitals:   12/14/13 1600 12/14/13 1615 12/14/13 1630 12/14/13 1700  BP: 112/61 131/71 118/67 121/72  Pulse: 96 102 100 96  Temp:      TempSrc:      Resp:    28  Weight:      SpO2: 99% 100% 100% 100%    Wt Readings from Last 3 Encounters:  12/14/13 89.812 kg (198 lb)  02/25/13 93.214 kg (205 lb 8 oz)  02/25/13 93.214 kg (205 lb 8 oz)    General:  Appears calm and comfortable, AAOx3, very pale, obese Eyes: PERRL, normal lids, irises & conjunctiva ENT: grossly normal  lips & tongue Neck: no LAD, masses or thyromegaly Cardiovascular: RRR, no m/r/g. No LE edema. Respiratory: CTA  bilaterally, no w/r/r. Normal respiratory effort. Abdomen: soft, ntnd, obese, BS present Skin: no rash or induration seen on limited exam Musculoskeletal: grossly normal tone BUE/BLE, trace edema  Psychiatric: grossly normal mood and affect, speech fluent and appropriate Neurologic: grossly non-focal.          Labs on Admission:  Basic Metabolic Panel:  Recent Labs Lab 12/14/13 1408  NA 136*  K 3.7  CL 100  CO2 23  GLUCOSE 99  BUN 12  CREATININE 1.11  CALCIUM 8.5   Liver Function Tests:  Recent Labs Lab 12/14/13 1408  AST 12  ALT 8  ALKPHOS 53  BILITOT 0.2*  PROT 6.7  ALBUMIN 3.4*   No results found for this basename: LIPASE, AMYLASE,  in the last 168 hours No results found for this basename: AMMONIA,  in the last 168 hours CBC:  Recent Labs Lab 12/14/13 1408  WBC 7.7  HGB 5.5*  HCT 18.3*  MCV 74.4*  PLT 282   Cardiac Enzymes: No results found for this basename: CKTOTAL, CKMB, CKMBINDEX, TROPONINI,  in the last 168 hours  BNP (last 3 results) No results found for this basename: PROBNP,  in the last 8760 hours CBG: No results found for this basename: GLUCAP,  in the last 168 hours  Radiological Exams on Admission: Ct Abdomen Pelvis W Contrast  12/14/2013   CLINICAL DATA:  Increasing hematuria over past couple weeks, shortness of breath, weakness, now with hemoglobin of 5 g, history of bladder cancer 1 year ago  EXAM: CT ABDOMEN AND PELVIS WITH CONTRAST  TECHNIQUE: Multidetector CT imaging of the abdomen and pelvis was performed using the standard protocol following bolus administration of intravenous contrast. Sagittal and coronal MPR images reconstructed from axial data set.  CONTRAST:  3mL OMNIPAQUE IOHEXOL 300 MG/ML SOLN IV. Dilute oral contrast.  COMPARISON:  None.  FINDINGS: Lung bases clear.  Multiple hepatic cysts largest 18 x 17 mm.  Tiny calcified granulomata within spleen.  Liver, spleen, pancreas, kidneys and adrenal glands otherwise normal  appearance.  No hydronephrosis, ureteral calcification or ureteral dilatation.  Abnormal appearance of the urinary bladder with an enhancing irregular soft tissue mass at the LEFT lateral aspect measuring 5.3 x 4.0 x 5.6 cm in size.  This most likely represents a bladder neoplasm.  Infiltration of adjacent fat planes in prevesical space is concerning for direct tumor extension.  Tumor extends to near the LEFT ureterovesical junction without obstruction/ureteral dilatation.  Dependent intermediate attenuation within the bladder could represent a second tumor nodule or dependent blood clot 2.6 x 1.8 x 1.7 cm.  No definite pelvic adenopathy or perivesical tumor nodularity.  Prostate gland and seminal vesicles grossly unremarkable.  Mild hyperemia in LEFT perivesical soft tissues.  Normal appendix.  Scattered colonic diverticulosis greatest at sigmoid colon.  Stomach and bowel loops otherwise normal appearance.  No additional mass, adenopathy, free fluid, or free air.  Small umbilical hernia containing fat.  No definite osseous metastatic lesions.  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  IMPRESSION: 5.3 x 4.0 x 5.6 cm diameter irregular enhancing mass at the the LEFT lateral aspect of the urinary bladder compatible with bladder neoplasm with probable direct extension of tumor into the prevesical space.  Small amount of blood clot or less likely an additional tumor nodule dependently in bladder.  Bladder tumor extends to near the LEFT ureterovesical junction without causing LEFT hydronephrosis or ureteral dilatation.  No definite adenopathy or metastatic lesions.  Sigmoid diverticulosis without diverticulitis.  Hepatic cystic disease.  Umbilical hernia.  Findings called to Dr. Sabra Heck on 12/14/2013 at 1553 hours.   Electronically Signed   By: Lavonia Dana M.D.   On: 12/14/2013 15:54    Assessment/Plan Active Problems:   Anemia-due to acute on  chronic blood loss -gross hematuria with clots off and on for months -transfuse 2units PRBC, will need more -check anemia panel for Iron stores    Hematuria/ recurrent Bladder cancer -s/p TURBT in 12/14 -then declined further surgery and Chemo as recommended -Stop ASA -Urology Consulted, Dr.Wrenn, will see at [facility]   h/o Dyslipidemia -not on meds now  Code Status: DNR DVT Prophylaxis: SCDs Family Communication: d/w wife at bedside Disposition Plan: transfer to [place]  Time spent: 51min  Zachry Hopfensperger [facility] Pager [phone]

## 2013-12-14 NOTE — ED Notes (Addendum)
Had bladder cancer 1 year ago and started  To pass blood in urine off and on since June last couple of weeks has been heavy went to dr todaY  Because he was having sob and weakness and was found to have hgb of 5

## 2013-12-14 NOTE — ED Provider Notes (Signed)
CSN: [id number]     Arrival date & time 12/14/13  1315 History   First MD Initiated Contact with Patient 12/14/13 1408     Chief Complaint  Patient presents with  . Weakness  . Hematuria     (Consider location/radiation/quality/duration/timing/severity/associated sxs/prior Treatment) HPI Comments: 58 year old male with a history of bladder cancer diagnosed approximately one year ago who underwent local bladder tumor resection but declined to undergo cystectomy and chemotherapy. He presents with anemia. He states that he went to his doctor's office today because of severe fatigue which has gradually been worsening over the last couple of weeks associated with dyspnea on exertion. He feels pale, the symptoms are gradually worsening and become severe. He denies any pain including headache, neck pain, chest or back pain, abdominal pain, lower extremity pain or swelling. He has no rashes, no sore throat, no fevers chills nausea or vomiting and no diarrhea. He denies any blood in his stools but does endorse blood in his urine. He does take a baby aspirin. He denies any other medications or anticoagulants. He was found to have a hemoglobin of 5 at the doctor's office and sent to the emergency department.  Patient is a 58 y.o. male presenting with weakness and hematuria. The history is provided by the patient and the spouse.  Weakness  Hematuria    Past Medical History  Diagnosis Date  . Bladder tumor   . Nocturia   . Arthritis   . Wears glasses   . Cancer    Past Surgical History  Procedure Laterality Date  . Lumbar disc surgery  01-17-2013  . Inguinal hernia repair Right 1994  . Transurethral resection of bladder tumor N/A 02/25/2013    Procedure: TRANSURETHRAL RESECTION OF BLADDER TUMOR (TURBT) WITH POST OP MITOMYCIN-C;  Surgeon: Ardis Hughs, MD;  Location: [facility];  Service: Urology;  Laterality: N/A;  . Cystoscopy w/ retrogrades Bilateral 02/25/2013   Procedure: BILATERAL RETROGRADE PYELOGRAM;  Surgeon: Ardis Hughs, MD;  Location[facility];  Service: Urology;  Laterality: Bilateral;   No family history on file. History  Substance Use Topics  . Smoking status: Former Smoker -- 2.00 packs/day for 30 years    Types: Cigarettes    Quit date: 02/24/2004  . Smokeless tobacco: Never Used  . Alcohol Use: No    Review of Systems  Genitourinary: Positive for hematuria.  Neurological: Positive for weakness.  All other systems reviewed and are negative.     Allergies  Review of patient's allergies indicates no known allergies.  Home Medications   Prior to Admission medications   Medication Sig Start Date End Date Taking? Authorizing Provider  aspirin EC 81 MG tablet Take by mouth daily.    Yes Historical Provider, MD  Multiple Vitamin (MULTIVITAMIN) tablet Take 1 tablet by mouth daily.   Yes Historical Provider, MD  TURMERIC PO Take 1 tablet by mouth daily.   Yes Historical Provider, MD   BP 118/67  Pulse 100  Temp(Src) 98.2 F (36.8 C) (Oral)  Resp 25  Wt 198 lb (89.812 kg)  SpO2 100% Physical Exam  Nursing note and vitals reviewed. Constitutional: He appears well-developed and well-nourished. No distress.  HENT:  Head: Normocephalic and atraumatic.  Mouth/Throat: No oropharyngeal exudate.  Pale mucous membranes  Eyes: EOM are normal. Pupils are equal, round, and reactive to light. Right eye exhibits no discharge. Left eye exhibits no discharge. No scleral icterus.  Neck: Normal range of motion. Neck supple. No  JVD present. No thyromegaly present.  Cardiovascular: Normal rate, regular rhythm, normal heart sounds and intact distal pulses.  Exam reveals no gallop and no friction rub.   No murmur heard. Pulmonary/Chest: Effort normal and breath sounds normal. No respiratory distress. He has no wheezes. He has no rales.  Abdominal: Soft. Bowel sounds are normal. He exhibits no distension and no mass.  There is no tenderness.  Musculoskeletal: Normal range of motion. He exhibits no edema and no tenderness.  Lymphadenopathy:    He has no cervical adenopathy.  Neurological: He is alert. Coordination normal.  Skin: Skin is warm and dry. No rash noted. No erythema.  Psychiatric: He has a normal mood and affect. His behavior is normal.    ED Course  Procedures (including critical care time) Labs Review Labs Reviewed  COMPREHENSIVE METABOLIC PANEL - Abnormal; Notable for the following:    Sodium 136 (*)    Albumin 3.4 (*)    Total Bilirubin 0.2 (*)    GFR calc non Af Amer 70 (*)    GFR calc Af Amer 82 (*)    All other components within normal limits  CBC - Abnormal; Notable for the following:    RBC 2.46 (*)    Hemoglobin 5.5 (*)    HCT 18.3 (*)    MCV 74.4 (*)    MCH 22.4 (*)    RDW 18.8 (*)    All other components within normal limits  URINALYSIS, ROUTINE W REFLEX MICROSCOPIC - Abnormal; Notable for the following:    Color, Urine RED (*)    APPearance TURBID (*)    Hgb urine dipstick LARGE (*)    Ketones, ur 15 (*)    Protein, ur >300 (*)    Leukocytes, UA SMALL (*)    All other components within normal limits  URINE MICROSCOPIC-ADD ON - Abnormal; Notable for the following:    Bacteria, UA MANY (*)    Casts HYALINE CASTS (*)    All other components within normal limits  TYPE AND SCREEN  ABO/RH  PREPARE RBC (CROSSMATCH)    Imaging Review Ct Abdomen Pelvis W Contrast  12/14/2013   CLINICAL DATA:  Increasing hematuria over past couple weeks, shortness of breath, weakness, now with hemoglobin of 5 g, history of bladder cancer 1 year ago  EXAM: CT ABDOMEN AND PELVIS WITH CONTRAST  TECHNIQUE: Multidetector CT imaging of the abdomen and pelvis was performed using the standard protocol following bolus administration of intravenous contrast. Sagittal and coronal MPR images reconstructed from axial data set.  CONTRAST:  99mL OMNIPAQUE IOHEXOL 300 MG/ML SOLN IV. Dilute oral  contrast.  COMPARISON:  None.  FINDINGS: Lung bases clear.  Multiple hepatic cysts largest 18 x 17 mm.  Tiny calcified granulomata within spleen.  Liver, spleen, pancreas, kidneys and adrenal glands otherwise normal appearance.  No hydronephrosis, ureteral calcification or ureteral dilatation.  Abnormal appearance of the urinary bladder with an enhancing irregular soft tissue mass at the LEFT lateral aspect measuring 5.3 x 4.0 x 5.6 cm in size.  This most likely represents a bladder neoplasm.  Infiltration of adjacent fat planes in prevesical space is concerning for direct tumor extension.  Tumor extends to near the LEFT ureterovesical junction without obstruction/ureteral dilatation.  Dependent intermediate attenuation within the bladder could represent a second tumor nodule or dependent blood clot 2.6 x 1.8 x 1.7 cm.  No definite pelvic adenopathy or perivesical tumor nodularity.  Prostate gland and seminal vesicles grossly unremarkable.  Mild hyperemia in LEFT  perivesical soft tissues.  Normal appendix.  Scattered colonic diverticulosis greatest at sigmoid colon.  Stomach and bowel loops otherwise normal appearance.  No additional mass, adenopathy, free fluid, or free air.  Small umbilical hernia containing fat.  No definite osseous metastatic lesions.  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  IMPRESSION: 5.3 x 4.0 x 5.6 cm diameter irregular enhancing mass at the the LEFT lateral aspect of the urinary bladder compatible with bladder neoplasm with probable direct extension of tumor into the prevesical space.  Small amount of blood clot or less likely an additional tumor nodule dependently in bladder.  Bladder tumor extends to near the LEFT ureterovesical junction without causing LEFT hydronephrosis or ureteral dilatation.  No definite adenopathy or metastatic lesions.  Sigmoid diverticulosis without diverticulitis.  Hepatic cystic disease.   Umbilical hernia.  Findings called to Dr. Sabra Heck on 12/14/2013 at 1553 hours.   Electronically Signed   By: Lavonia Dana M.D.   On: 12/14/2013 15:54      MDM   Final diagnoses:  Bladder neoplasm  Severe anemia    Other than appearing extremely pale and mild tachycardia the patient has no other acute findings. His abdomen is very soft and nontender however I suspect that due to his recurrent hematuria and he may have ongoing bladder tumor burden or cancer issues, possibly metastatic disease, labs, CT scan of the abdomen and pelvis, saline lock be placed and I anticipate admission.  D/w Dr. Jeffie Pollock, will see in consultation when pt arrives - likely in AM - requests admit to hospitalist service.    Wills tart transfusion.  Discussed care with Dr. Broadus John of the hospitalist service who will admit the patient at [facility] long. The patient has a severe anemia and is requiring blood products in the emergency department.  CRITICAL CARE Performed by: Johnna Acosta Total critical care time: 35 Critical care time was exclusive of separately billable procedures and treating other patients. Critical care was necessary to treat or prevent imminent or life-threatening deterioration. Critical care was time spent personally by me on the following activities: development of treatment plan with patient and/or surrogate as well as nursing, discussions with consultants, evaluation of patient's response to treatment, examination of patient, obtaining history from patient or surrogate, ordering and performing treatments and interventions, ordering and review of laboratory studies, ordering and review of radiographic studies, pulse oximetry and re-evaluation of patient's condition.   Meds given in ED:  Medications  0.9 %  sodium chloride infusion (not administered)  iohexol (OMNIPAQUE) 300 MG/ML solution 25 mL (25 mLs Oral Contrast Given 12/14/13 1443)  iohexol (OMNIPAQUE) 300 MG/ML solution 80 mL (80 mLs  Intravenous Contrast Given 12/14/13 1518)    New Prescriptions   No medications on file      Johnna Acosta, MD 12/14/13 1656

## 2013-12-15 LAB — TYPE AND SCREEN
ABO/RH(D): O POS
ANTIBODY SCREEN: NEGATIVE
UNIT DIVISION: 0
Unit division: 0
Unit division: 0
Unit division: 0

## 2013-12-15 LAB — IRON AND TIBC: UIBC: 437 ug/dL — AB (ref 125–400)

## 2013-12-15 LAB — RETICULOCYTES
RBC.: 2.83 MIL/uL — ABNORMAL LOW (ref 4.22–5.81)
Retic Count, Absolute: 65.1 10*3/uL (ref 19.0–186.0)
Retic Ct Pct: 2.3 % (ref 0.4–3.1)

## 2013-12-15 LAB — COMPREHENSIVE METABOLIC PANEL
ALT: 7 U/L (ref 0–53)
AST: 10 U/L (ref 0–37)
Albumin: 2.9 g/dL — ABNORMAL LOW (ref 3.5–5.2)
Alkaline Phosphatase: 46 U/L (ref 39–117)
Anion gap: 12 (ref 5–15)
BILIRUBIN TOTAL: 0.4 mg/dL (ref 0.3–1.2)
BUN: 9 mg/dL (ref 6–23)
CHLORIDE: 105 meq/L (ref 96–112)
CO2: 21 mEq/L (ref 19–32)
Calcium: 8.1 mg/dL — ABNORMAL LOW (ref 8.4–10.5)
Creatinine, Ser: 0.95 mg/dL (ref 0.50–1.35)
GFR, EST NON AFRICAN AMERICAN: 89 mL/min — AB (ref >90)
Glucose, Bld: 103 mg/dL — ABNORMAL HIGH (ref 70–99)
Potassium: 3.9 mEq/L (ref 3.7–5.3)
SODIUM: 138 meq/L (ref 137–147)
Total Protein: 5.9 g/dL — ABNORMAL LOW (ref 6.0–8.3)

## 2013-12-15 LAB — CBC
HCT: 21.2 % — ABNORMAL LOW (ref 39.0–52.0)
Hemoglobin: 6.6 g/dL — CL (ref 13.0–17.0)
MCH: 23.7 pg — ABNORMAL LOW (ref 26.0–34.0)
MCHC: 31.1 g/dL (ref 30.0–36.0)
MCV: 76 fL — AB (ref 78.0–100.0)
PLATELETS: 224 10*3/uL (ref 150–400)
RBC: 2.79 MIL/uL — AB (ref 4.22–5.81)
RDW: 18.6 % — ABNORMAL HIGH (ref 11.5–15.5)
WBC: 5.9 10*3/uL (ref 4.0–10.5)

## 2013-12-15 LAB — ABO/RH: ABO/RH(D): O POS

## 2013-12-15 LAB — PROTIME-INR
INR: 1.11 (ref 0.00–1.49)
Prothrombin Time: 14.3 seconds (ref 11.6–15.2)

## 2013-12-15 LAB — FOLATE: Folate: 19.4 ng/mL

## 2013-12-15 LAB — HEMOGLOBIN AND HEMATOCRIT, BLOOD
HCT: 22.6 % — ABNORMAL LOW (ref 39.0–52.0)
HCT: 26.6 % — ABNORMAL LOW (ref 39.0–52.0)
HEMOGLOBIN: 8.4 g/dL — AB (ref 13.0–17.0)
Hemoglobin: 7.2 g/dL — ABNORMAL LOW (ref 13.0–17.0)

## 2013-12-15 LAB — FERRITIN: Ferritin: 4 ng/mL — ABNORMAL LOW (ref 22–322)

## 2013-12-15 LAB — VITAMIN B12: Vitamin B-12: 302 pg/mL (ref 211–911)

## 2013-12-15 MED ORDER — FUROSEMIDE 10 MG/ML IJ SOLN
20.0000 mg | Freq: Once | INTRAMUSCULAR | Status: AC
  Administered 2013-12-15: 20 mg via INTRAVENOUS
  Filled 2013-12-15: qty 2

## 2013-12-15 MED ORDER — DIPHENHYDRAMINE HCL 50 MG/ML IJ SOLN: 25.0000 mg | Freq: Four times a day (QID) | INTRAMUSCULAR | Status: DC | PRN

## 2013-12-15 MED ORDER — SODIUM CHLORIDE 0.9 % IV SOLN: Freq: Once | INTRAVENOUS | Status: DC

## 2013-12-15 NOTE — Progress Notes (Signed)
Patient Demographics  Erik Gross, is a 58 y.o. male, DOB - May 06, 1955, XMI:[id number]  Admit date - 12/14/2013   Admitting Physician Domenic Polite, MD  Outpatient Primary MD for the patient is Rusty Aus., MD  LOS - 1   Chief Complaint  Patient presents with  . Weakness  . Hematuria        Subjective:   Erik Gross today has, No headache, No chest pain, No abdominal pain - No Nausea, No new weakness tingling or numbness, No Cough - SOB.    Assessment & Plan    1. Severe microcytic anemia due to ongoing blood loss from hematuria. 2 units packed RBCs transfused, we'll transfuse 2 more 12/15/2013, anemia panel pending, will give IV iron if needed. Monitor H&H. Hemodynamically stable.   2. Bladder cancer with evidence of left sided hydronephrosis. Urology consulted await input.   3. Dyslipidemia. Outpatient followup     Code Status: Full  Family Communication: none  present  Disposition Plan: Home   Procedures CT scan abdomen pelvis   Consults  Urology Dr. Raynelle Highland   Medications  Scheduled Meds: . sodium chloride   Intravenous Once  . furosemide  20 mg Intravenous Once   Continuous Infusions:  PRN Meds:.acetaminophen, diphenhydrAMINE, ondansetron (ZOFRAN) IV  DVT Prophylaxis    SCDs    Lab Results  Component Value Date   PLT 224 12/15/2013    Antibiotics     Anti-infectives   None          Objective:   Filed Vitals:   12/14/13 2018 12/14/13 2058 12/14/13 2212 12/15/13 0637  BP: 117/73 120/71 96/48 118/67  Pulse: 92 88 93 86  Temp: 99.4 F (37.4 C) 98.2 F (36.8 C) 99 F (37.2 C) 97.7 F (36.5 C)  TempSrc: Oral Oral Oral Oral  Resp: 24 20 18 20   Height:  5\' 6"  (1.676 m)    Weight:  89.948 kg (198 lb 4.8 oz)    SpO2: 93% 100% 98% 99%    Wt  Readings from Last 3 Encounters:  12/14/13 89.948 kg (198 lb 4.8 oz)  02/25/13 93.214 kg (205 lb 8 oz)  02/25/13 93.214 kg (205 lb 8 oz)     Intake/Output Summary (Last 24 hours) at 12/15/13 1011 Last data filed at 12/14/13 2212  Gross per 24 hour  Intake    315 ml  Output      0 ml  Net    315 ml     Physical Exam  Awake Alert, Oriented X 3, No new F.N deficits, Normal affect [place].AT,PERRAL Supple Neck,No JVD, No cervical lymphadenopathy appriciated.  Symmetrical Chest wall movement, Good air movement bilaterally, CTAB RRR,No Gallops,Rubs or new Murmurs, No Parasternal Heave +ve B.Sounds, Abd Soft, No tenderness, No organomegaly appriciated, No rebound - guarding or rigidity. No Cyanosis, Clubbing or edema, No new Rash or bruise      Data Review   Micro Results No results found for this or any previous visit (from the past 240 hour(s)).  Radiology Reports Ct Abdomen Pelvis W Contrast  12/14/2013   CLINICAL DATA:  Increasing hematuria over past couple weeks, shortness of breath, weakness, now with hemoglobin of 5 g, history of bladder cancer 1 year ago  EXAM:  CT ABDOMEN AND PELVIS WITH CONTRAST  TECHNIQUE: Multidetector CT imaging of the abdomen and pelvis was performed using the standard protocol following bolus administration of intravenous contrast. Sagittal and coronal MPR images reconstructed from axial data set.  CONTRAST:  19mL OMNIPAQUE IOHEXOL 300 MG/ML SOLN IV. Dilute oral contrast.  COMPARISON:  None.  FINDINGS: Lung bases clear.  Multiple hepatic cysts largest 18 x 17 mm.  Tiny calcified granulomata within spleen.  Liver, spleen, pancreas, kidneys and adrenal glands otherwise normal appearance.  No hydronephrosis, ureteral calcification or ureteral dilatation.  Abnormal appearance of the urinary bladder with an enhancing irregular soft tissue mass at the LEFT lateral aspect measuring 5.3 x 4.0 x 5.6 cm in size.  This most likely represents a bladder neoplasm.  Infiltration  of adjacent fat planes in prevesical space is concerning for direct tumor extension.  Tumor extends to near the LEFT ureterovesical junction without obstruction/ureteral dilatation.  Dependent intermediate attenuation within the bladder could represent a second tumor nodule or dependent blood clot 2.6 x 1.8 x 1.7 cm.  No definite pelvic adenopathy or perivesical tumor nodularity.  Prostate gland and seminal vesicles grossly unremarkable.  Mild hyperemia in LEFT perivesical soft tissues.  Normal appendix.  Scattered colonic diverticulosis greatest at sigmoid colon.  Stomach and bowel loops otherwise normal appearance.  No additional mass, adenopathy, free fluid, or free air.  Small umbilical hernia containing fat.  No definite osseous metastatic lesions.  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  IMPRESSION: 5.3 x 4.0 x 5.6 cm diameter irregular enhancing mass at the the LEFT lateral aspect of the urinary bladder compatible with bladder neoplasm with probable direct extension of tumor into the prevesical space.  Small amount of blood clot or less likely an additional tumor nodule dependently in bladder.  Bladder tumor extends to near the LEFT ureterovesical junction without causing LEFT hydronephrosis or ureteral dilatation.  No definite adenopathy or metastatic lesions.  Sigmoid diverticulosis without diverticulitis.  Hepatic cystic disease.  Umbilical hernia.  Findings called to Dr. Sabra Heck on 12/14/2013 at 1553 hours.   Electronically Signed   By: Lavonia Dana M.D.   On: 12/14/2013 15:54     CBC  Recent Labs Lab 12/14/13 1408 12/15/13 0120 12/15/13 0550  WBC 7.7  --  5.9  HGB 5.5* 7.2* 6.6*  HCT 18.3* 22.6* 21.2*  PLT 282  --  224  MCV 74.4*  --  76.0*  MCH 22.4*  --  23.7*  MCHC 30.1  --  31.1  RDW 18.8*  --  18.6*    Chemistries   Recent Labs Lab 12/14/13 1408 12/15/13 0550  NA 136* 138  K 3.7 3.9  CL 100 105  CO2 23  21  GLUCOSE 99 103*  BUN 12 9  CREATININE 1.11 0.95  CALCIUM 8.5 8.1*  AST 12 10  ALT 8 7  ALKPHOS 53 46  BILITOT 0.2* 0.4   ------------------------------------------------------------------------------------------------------------------ estimated creatinine clearance is 86.8 ml/min (by C-G formula based on Cr of 0.95). ------------------------------------------------------------------------------------------------------------------ No results found for this basename: HGBA1C,  in the last 72 hours ------------------------------------------------------------------------------------------------------------------ No results found for this basename: CHOL, HDL, LDLCALC, TRIG, CHOLHDL, LDLDIRECT,  in the last 72 hours ------------------------------------------------------------------------------------------------------------------ No results found for this basename: TSH, T4TOTAL, FREET3, T3FREE, THYROIDAB,  in the last 72 hours ------------------------------------------------------------------------------------------------------------------  Recent Labs  12/15/13 0550  RETICCTPCT 2.3    Coagulation profile  Recent Labs Lab 12/15/13 0550  INR 1.11  No results found for this basename: DDIMER,  in the last 72 hours  Cardiac Enzymes No results found for this basename: CK, CKMB, TROPONINI, MYOGLOBIN,  in the last 168 hours ------------------------------------------------------------------------------------------------------------------ No components found with this basename: POCBNP,      Time Spent in minutes   35   Burnice Oestreicher K M.D on 12/15/2013 at 10:11 AM  Between 7am to 7pm - Pager - [phone]  After 7pm go to [email or web address] - password [email or web address]  And look for the night coverage person covering for me after hours  [facility] Office  [phone]   **Disclaimer: This note may have been dictated with voice recognition software. Similar sounding words  can inadvertently be transcribed and this note may contain transcription errors which may not have been corrected upon publication of note.**

## 2013-12-15 NOTE — Progress Notes (Addendum)
CRITICAL VALUE ALERT  Critical value received:  Hgb 6.6  Date of notification:  12/15/13  Time of notification:  0635  Critical value read back:Yes.    Nurse who received alert:  Ruben Im, RN  MD notified (1st page):  Verbally notified Dr. Aleene Davidson  Time of first page:    MD notified (2nd page):  Time of second page:  Responding MD:  Verbally notified Dr. Aleene Davidson on am rounds  Time MD responded:  9811

## 2013-12-15 NOTE — Consult Note (Signed)
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  At that time it was recommended to him to undergo curative therapy first neoadjuvant chemotherapy followed by surgery.  He was reluctant to undergo any aggressive therapy for various reasons and was lost to follow-up.  He subsequently presented to the ED with weakness and fatigue and was found to be profoundly anemic.  A CT scan revealed recurrence of his bladder cancer with what appears to be extension of the tumor through the bladder wall.  Interval: Patient has been tranfused 4 units of PRBCs.  Hgb up to 8.4. Patient refused foley and clot evac but has been able to void clots. No complaints of pelvic pain, bone pain Appetite is good, moving his bowels without issue.  PE: Filed Vitals:   12/15/13 1035 12/15/13 1250 12/15/13 1330 12/15/13 1550  BP: 108/64 111/64 117/67 116/61  Pulse: 90 84 91 89  Temp: 98.4 F (36.9 C) 97.9 F (36.6 C) 98.3 F (36.8 C) 98.6 F (37 C)  TempSrc: Oral Oral Oral Oral  Resp: 18 20 20 18   Height:      Weight:      SpO2: 98% 100% 99% 100%  NAD Non-labored breathing Abdomen is soft, non-tender Ext symmetric   Recent Labs  12/14/13 1408 12/15/13 0120 12/15/13 0550 12/15/13 1800  WBC 7.7  --  5.9  --   HGB 5.5* 7.2* 6.6* 8.4*  HCT 18.3* 22.6* 21.2* 26.6*    Recent Labs  12/14/13 1408 12/15/13 0550  NA 136* 138  K 3.7 3.9  CL 100 105  CO2 23 21  GLUCOSE 99 103*  BUN 12 9  CREATININE 1.11 0.95  CALCIUM 8.5 8.1*    Recent Labs  12/15/13 0550  INR 1.11   No results found for this basename: LABURIN,  in the last 72 hours Results for orders placed during the hospital encounter of 02/25/13  URINE CULTURE     Status: None   Collection Time    02/25/13  7:15 AM      Result Value Ref Range Status   Specimen Description URINE, RANDOM   Final   Special Requests NONE   Final   Culture  Setup Time      Final   Value: 02/25/2013 12:37     Performed at [facility]     Final   Value: NO GROWTH     Performed at [facility]   Culture     Final   Value: NO GROWTH     Performed at [facility]   Report Status 02/26/2013 FINAL   Final    CT a/p:  I have independently reviewed the images:  Impression follows -  5.3 x 4.0 x 5.6 cm diameter irregular enhancing mass at the the LEFT  lateral aspect of the urinary bladder compatible with bladder  neoplasm with probable direct extension of tumor into the prevesical  space.  Small amount of blood clot or less likely an additional tumor nodule  dependently in bladder.  Bladder tumor extends to near the LEFT ureterovesical junction  without causing LEFT hydronephrosis or ureteral dilatation.  No definite adenopathy or metastatic lesions.  Sigmoid diverticulosis without diverticulitis.  Hepatic cystic disease.  Umbilical hernia.  Imp: 55M with what appears to be High grade T3 transitional cell carcinoma, with local extension of the tumor through the  bladder wall, and acute blood loss secondary to hematuria.    Unclear if patient's hemoglobin has stabilized and if his bleeding has stopped.Marland KitchenHe may need clot evac and resection/fulgartion of bleeding/necrotic tumor in the OR.   I suspect that he may well continue to have on-going intermittent bleeding until his tumor/bladder  is removed entirely.   Rec: Patient will need a chest CT scan to evaluate for metastatic disease, although the current imaging does not show anything more than local extension of the tumor.   In addition, would get oncology to see patient to weigh in on whether patient is candidate for neoadjuvant chemotherapy.  If the tumor responds then cystectomy may be worth considering.  Finally, would continue to trend hemoglobin and if it continues to drop I can add him on to the end of the surgery schedule tomorrow in attempt to gain some control of the  bleeding.  Having discussed the patient's current circumstance he now appears to be more amenable to aggressive treatment.

## 2013-12-16 ENCOUNTER — Encounter (HOSPITAL_COMMUNITY): Payer: BC Managed Care – PPO | Admitting: Anesthesiology

## 2013-12-16 ENCOUNTER — Encounter (HOSPITAL_COMMUNITY)
Admission: EM | Disposition: A | Discharge status: Home / Self Care | Payer: Self-pay | Source: Home / Self Care | Attending: Internal Medicine

## 2013-12-16 ENCOUNTER — Encounter (HOSPITAL_COMMUNITY): Payer: Self-pay | Admitting: Anesthesiology

## 2013-12-16 ENCOUNTER — Inpatient Hospital Stay (HOSPITAL_COMMUNITY): Payer: BC Managed Care – PPO | Admitting: Anesthesiology

## 2013-12-16 DIAGNOSIS — C649 Malignant neoplasm of unspecified kidney, except renal pelvis: Secondary | ICD-10-CM

## 2013-12-16 DIAGNOSIS — D509 Iron deficiency anemia, unspecified: Secondary | ICD-10-CM

## 2013-12-16 DIAGNOSIS — C50919 Malignant neoplasm of unspecified site of unspecified female breast: Secondary | ICD-10-CM

## 2013-12-16 DIAGNOSIS — C7951 Secondary malignant neoplasm of bone: Secondary | ICD-10-CM

## 2013-12-16 DIAGNOSIS — C779 Secondary and unspecified malignant neoplasm of lymph node, unspecified: Secondary | ICD-10-CM

## 2013-12-16 DIAGNOSIS — C7952 Secondary malignant neoplasm of bone marrow: Secondary | ICD-10-CM

## 2013-12-16 HISTORY — PX: TRANSURETHRAL RESECTION OF BLADDER TUMOR WITH GYRUS (TURBT-GYRUS): SHX6458

## 2013-12-16 LAB — CBC
HCT: 26.5 % — ABNORMAL LOW (ref 39.0–52.0)
HEMOGLOBIN: 8.6 g/dL — AB (ref 13.0–17.0)
MCH: 24.9 pg — AB (ref 26.0–34.0)
MCHC: 32.5 g/dL (ref 30.0–36.0)
MCV: 76.6 fL — ABNORMAL LOW (ref 78.0–100.0)
Platelets: 231 10*3/uL (ref 150–400)
RBC: 3.46 MIL/uL — ABNORMAL LOW (ref 4.22–5.81)
RDW: 18.4 % — ABNORMAL HIGH (ref 11.5–15.5)
WBC: 7.5 10*3/uL (ref 4.0–10.5)

## 2013-12-16 LAB — TYPE AND SCREEN
ABO/RH(D): O POS
ANTIBODY SCREEN: NEGATIVE
Unit division: 0
Unit division: 0

## 2013-12-16 LAB — BASIC METABOLIC PANEL
Anion gap: 13 (ref 5–15)
BUN: 10 mg/dL (ref 6–23)
CALCIUM: 8.3 mg/dL — AB (ref 8.4–10.5)
CO2: 21 mEq/L (ref 19–32)
Chloride: 102 mEq/L (ref 96–112)
Creatinine, Ser: 0.95 mg/dL (ref 0.50–1.35)
GFR calc Af Amer: >90 mL/min (ref >90)
GFR, EST NON AFRICAN AMERICAN: 89 mL/min — AB (ref >90)
Glucose, Bld: 113 mg/dL — ABNORMAL HIGH (ref 70–99)
Potassium: 3.9 mEq/L (ref 3.7–5.3)
SODIUM: 136 meq/L — AB (ref 137–147)

## 2013-12-16 LAB — SURGICAL PCR SCREEN
MRSA, PCR: NEGATIVE
STAPHYLOCOCCUS AUREUS: NEGATIVE

## 2013-12-16 SURGERY — TRANSURETHRAL RESECTION OF BLADDER TUMOR WITH GYRUS (TURBT-GYRUS)
Anesthesia: General | Site: Bladder

## 2013-12-16 MED ORDER — MIDAZOLAM HCL 5 MG/5ML IJ SOLN
INTRAMUSCULAR | Status: DC | PRN
  Administered 2013-12-16: 2 mg via INTRAVENOUS

## 2013-12-16 MED ORDER — BELLADONNA ALKALOIDS-OPIUM 16.2-60 MG RE SUPP: 1.0000 | Freq: Four times a day (QID) | RECTAL | Status: DC | PRN

## 2013-12-16 MED ORDER — SODIUM CHLORIDE 0.9 % IR SOLN
Status: DC | PRN
  Administered 2013-12-16: 9000 mL via INTRAVESICAL

## 2013-12-16 MED ORDER — PHENOL 1.4 % MT LIQD
1.0000 | OROMUCOSAL | Status: DC | PRN
  Filled 2013-12-16: qty 177

## 2013-12-16 MED ORDER — FENTANYL CITRATE 0.05 MG/ML IJ SOLN
INTRAMUSCULAR | Status: AC
  Filled 2013-12-16: qty 2

## 2013-12-16 MED ORDER — CIPROFLOXACIN IN D5W 400 MG/200ML IV SOLN
INTRAVENOUS | Status: AC
  Filled 2013-12-16: qty 200

## 2013-12-16 MED ORDER — OXYCODONE HCL 5 MG PO TABS
5.0000 mg | ORAL_TABLET | ORAL | Status: DC | PRN
  Administered 2013-12-16 – 2013-12-17 (×2): 5 mg via ORAL
  Filled 2013-12-16 (×2): qty 1

## 2013-12-16 MED ORDER — MIDAZOLAM HCL 2 MG/2ML IJ SOLN
INTRAMUSCULAR | Status: AC
  Filled 2013-12-16: qty 2

## 2013-12-16 MED ORDER — 0.9 % SODIUM CHLORIDE (POUR BTL) OPTIME
TOPICAL | Status: DC | PRN
  Administered 2013-12-16: 2000 mL

## 2013-12-16 MED ORDER — BACITRACIN-NEOMYCIN-POLYMYXIN 400-5-5000 EX OINT: 1.0000 "application " | TOPICAL_OINTMENT | Freq: Three times a day (TID) | CUTANEOUS | Status: DC | PRN

## 2013-12-16 MED ORDER — FENTANYL CITRATE 0.05 MG/ML IJ SOLN
INTRAMUSCULAR | Status: DC | PRN
  Administered 2013-12-16 (×3): 50 ug via INTRAVENOUS

## 2013-12-16 MED ORDER — KCL IN DEXTROSE-NACL 20-5-0.45 MEQ/L-%-% IV SOLN
INTRAVENOUS | Status: DC
  Administered 2013-12-16 – 2013-12-17 (×3): via INTRAVENOUS
  Filled 2013-12-16 (×4): qty 1000

## 2013-12-16 MED ORDER — LACTATED RINGERS IV SOLN
INTRAVENOUS | Status: DC
  Administered 2013-12-16: 17:00:00 via INTRAVENOUS
  Administered 2013-12-16: 1000 mL via INTRAVENOUS

## 2013-12-16 MED ORDER — FENTANYL CITRATE 0.05 MG/ML IJ SOLN
25.0000 ug | INTRAMUSCULAR | Status: DC | PRN
  Administered 2013-12-16 (×2): 50 ug via INTRAVENOUS

## 2013-12-16 MED ORDER — CIPROFLOXACIN IN D5W 400 MG/200ML IV SOLN
400.0000 mg | Freq: Once | INTRAVENOUS | Status: AC
  Administered 2013-12-16: 400 mg via INTRAVENOUS

## 2013-12-16 MED ORDER — BELLADONNA ALKALOIDS-OPIUM 16.2-60 MG RE SUPP
RECTAL | Status: AC
  Filled 2013-12-16: qty 1

## 2013-12-16 MED ORDER — BELLADONNA ALKALOIDS-OPIUM 16.2-60 MG RE SUPP
RECTAL | Status: DC | PRN
  Administered 2013-12-16: 1 via RECTAL

## 2013-12-16 MED ORDER — POTASSIUM CHLORIDE 2 MEQ/ML IV SOLN: INTRAVENOUS | Status: DC

## 2013-12-16 MED ORDER — FENTANYL CITRATE 0.05 MG/ML IJ SOLN
INTRAMUSCULAR | Status: AC
  Administered 2013-12-16: 50 ug via INTRAVENOUS
  Filled 2013-12-16: qty 2

## 2013-12-16 MED ORDER — LIDOCAINE HCL 2 % EX GEL
CUTANEOUS | Status: DC | PRN
  Administered 2013-12-16: 1 via TOPICAL

## 2013-12-16 MED ORDER — MENTHOL 3 MG MT LOZG
1.0000 | LOZENGE | OROMUCOSAL | Status: DC | PRN
  Filled 2013-12-16: qty 9

## 2013-12-16 MED ORDER — PROPOFOL 10 MG/ML IV BOLUS
INTRAVENOUS | Status: DC | PRN
  Administered 2013-12-16: 170 mg via INTRAVENOUS

## 2013-12-16 MED ORDER — DOCUSATE SODIUM 100 MG PO CAPS
100.0000 mg | ORAL_CAPSULE | Freq: Two times a day (BID) | ORAL | Status: DC
  Administered 2013-12-16 – 2013-12-17 (×2): 100 mg via ORAL
  Filled 2013-12-16 (×3): qty 1

## 2013-12-16 MED ORDER — ZOLPIDEM TARTRATE 5 MG PO TABS
5.0000 mg | ORAL_TABLET | Freq: Every evening | ORAL | Status: DC | PRN
Start: 2013-12-16 — End: 2013-12-17

## 2013-12-16 MED ORDER — MORPHINE SULFATE 2 MG/ML IJ SOLN: 2.0000 mg | INTRAMUSCULAR | Status: DC | PRN

## 2013-12-16 MED ORDER — OXYBUTYNIN CHLORIDE 5 MG/5ML PO SYRP
5.0000 mg | ORAL_SOLUTION | Freq: Two times a day (BID) | ORAL | Status: DC
  Administered 2013-12-16 – 2013-12-17 (×2): 5 mg via ORAL
  Filled 2013-12-16 (×3): qty 5

## 2013-12-16 MED ORDER — PROPOFOL 10 MG/ML IV BOLUS
INTRAVENOUS | Status: AC
  Filled 2013-12-16: qty 20

## 2013-12-16 MED ORDER — LACTATED RINGERS IV SOLN: INTRAVENOUS | Status: DC

## 2013-12-16 MED ORDER — LIDOCAINE HCL 2 % EX GEL
CUTANEOUS | Status: AC
  Filled 2013-12-16: qty 10

## 2013-12-16 SURGICAL SUPPLY — 26 items
BAG URINE DRAINAGE (UROLOGICAL SUPPLIES) ×3 IMPLANT
BAG URO CATCHER STRL LF (DRAPE) ×3 IMPLANT
CATH FOLEY 3WAY 30CC 24FR (CATHETERS) ×2
CATH URTH STD 24FR FL 3W 2 (CATHETERS) ×1 IMPLANT
DRAPE CAMERA CLOSED 9X96 (DRAPES) ×3 IMPLANT
ELECT BUTTON HF 24-28F 2 30DE (ELECTRODE) IMPLANT
ELECT LOOP MED HF 24F 12D (CUTTING LOOP) IMPLANT
ELECT LOOP MED HF 24F 12D CBL (CLIP) ×3 IMPLANT
ELECT REM PT RETURN 9FT ADLT (ELECTROSURGICAL)
ELECT RESECT VAPORIZE 12D CBL (ELECTRODE) IMPLANT
ELECTRODE REM PT RTRN 9FT ADLT (ELECTROSURGICAL) IMPLANT
EVACUATOR ELLICK (MISCELLANEOUS) ×3 IMPLANT
EVACUATOR MICROVAS BLADDER (UROLOGICAL SUPPLIES) ×3 IMPLANT
GLOVE BIOGEL PI IND STRL 7.5 (GLOVE) ×1 IMPLANT
GLOVE BIOGEL PI INDICATOR 7.5 (GLOVE) ×2
GLOVE ECLIPSE 7.0 STRL STRAW (GLOVE) ×3 IMPLANT
GOWN STRL REUS W/TWL XL LVL3 (GOWN DISPOSABLE) ×9 IMPLANT
KIT ASPIRATION TUBING (SET/KITS/TRAYS/PACK) IMPLANT
KIT BASIN OR (CUSTOM PROCEDURE TRAY) ×3 IMPLANT
MANIFOLD NEPTUNE II (INSTRUMENTS) ×3 IMPLANT
PACK CYSTO (CUSTOM PROCEDURE TRAY) ×3 IMPLANT
SCRUB PCMX 4 OZ (MISCELLANEOUS) ×3 IMPLANT
SYR 30ML LL (SYRINGE) ×3 IMPLANT
SYRINGE IRR TOOMEY STRL 70CC (SYRINGE) ×3 IMPLANT
TUBING CONNECTING 10 (TUBING) ×2 IMPLANT
TUBING CONNECTING 10' (TUBING) ×1

## 2013-12-16 NOTE — Anesthesia Preprocedure Evaluation (Addendum)
Anesthesia Evaluation  Patient identified by MRN, date of birth, ID band Patient awake    Reviewed: Allergy & Precautions, H&P , NPO status , Patient's Chart, lab work & pertinent test results  Airway Mallampati: II TM Distance: >3 FB Neck ROM: full    Dental  (+) Missing, Poor Dentition, Chipped, Dental Advisory Given Most of front teeth are either missing, broken, or chipped:   Pulmonary neg pulmonary ROS, former smoker,  breath sounds clear to auscultation  Pulmonary exam normal       Cardiovascular Exercise Tolerance: Good negative cardio ROS  Rhythm:regular Rate:Normal     Neuro/Psych negative neurological ROS  negative psych ROS   GI/Hepatic negative GI ROS, Neg liver ROS,   Endo/Other  negative endocrine ROS  Renal/GU negative Renal ROS  negative genitourinary   Musculoskeletal   Abdominal Normal abdominal exam  (+)   Peds  Hematology negative hematology ROS (+) anemia , hgb 8.6   Anesthesia Other Findings   Reproductive/Obstetrics negative OB ROS                          Anesthesia Physical Anesthesia Plan  ASA: III  Anesthesia Plan: General   Post-op Pain Management:    Induction: Intravenous  Airway Management Planned: LMA  Additional Equipment:   Intra-op Plan:   Post-operative Plan:   Informed Consent: I have reviewed the patients History and Physical, chart, labs and discussed the procedure including the risks, benefits and alternatives for the proposed anesthesia with the patient or authorized representative who has indicated his/her understanding and acceptance.   Dental Advisory Given  Plan Discussed with: CRNA and Surgeon  Anesthesia Plan Comments:         Anesthesia Quick Evaluation

## 2013-12-16 NOTE — Progress Notes (Signed)
Patient was taken to the operating room and all the blood clots evacuated from his bladder. I then resected the bleeding and necrotic tumor. At the end of the case I placed a 24 French three-way Foley catheter in place the patient on continuous bladder irrigation. This will be weaned over the next 24 hours and hopefully we will be of to remove his Foley catheter prior to discharge.

## 2013-12-16 NOTE — H&P (View-Only) (Signed)
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  At that time it was recommended to him to undergo curative therapy first neoadjuvant chemotherapy followed by surgery.  He was reluctant to undergo any aggressive therapy for various reasons and was lost to follow-up.  He subsequently presented to the ED with weakness and fatigue and was found to be profoundly anemic.  A CT scan revealed recurrence of his bladder cancer with what appears to be extension of the tumor through the bladder wall.  Interval: Patient has been tranfused 4 units of PRBCs.  Hgb up to 8.4. Patient refused foley and clot evac but has been able to void clots. No complaints of pelvic pain, bone pain Appetite is good, moving his bowels without issue.  PE: Filed Vitals:   12/15/13 1035 12/15/13 1250 12/15/13 1330 12/15/13 1550  BP: 108/64 111/64 117/67 116/61  Pulse: 90 84 91 89  Temp: 98.4 F (36.9 C) 97.9 F (36.6 C) 98.3 F (36.8 C) 98.6 F (37 C)  TempSrc: Oral Oral Oral Oral  Resp: 18 20 20 18   Height:      Weight:      SpO2: 98% 100% 99% 100%  NAD Non-labored breathing Abdomen is soft, non-tender Ext symmetric   Recent Labs  12/14/13 1408 12/15/13 0120 12/15/13 0550 12/15/13 1800  WBC 7.7  --  5.9  --   HGB 5.5* 7.2* 6.6* 8.4*  HCT 18.3* 22.6* 21.2* 26.6*    Recent Labs  12/14/13 1408 12/15/13 0550  NA 136* 138  K 3.7 3.9  CL 100 105  CO2 23 21  GLUCOSE 99 103*  BUN 12 9  CREATININE 1.11 0.95  CALCIUM 8.5 8.1*    Recent Labs  12/15/13 0550  INR 1.11   No results found for this basename: LABURIN,  in the last 72 hours Results for orders placed during the hospital encounter of 02/25/13  URINE CULTURE     Status: None   Collection Time    02/25/13  7:15 AM      Result Value Ref Range Status   Specimen Description URINE, RANDOM   Final   Special Requests NONE   Final   Culture  Setup Time      Final   Value: 02/25/2013 12:37     Performed at [place] [facility]     Final   Value: NO GROWTH     Performed at [facility]   Culture     Final   Value: NO GROWTH     Performed at [facility]   Report Status 02/26/2013 FINAL   Final    CT a/p:  I have independently reviewed the images:  Impression follows -  5.3 x 4.0 x 5.6 cm diameter irregular enhancing mass at the the LEFT  lateral aspect of the urinary bladder compatible with bladder  neoplasm with probable direct extension of tumor into the prevesical  space.  Small amount of blood clot or less likely an additional tumor nodule  dependently in bladder.  Bladder tumor extends to near the LEFT ureterovesical junction  without causing LEFT hydronephrosis or ureteral dilatation.  No definite adenopathy or metastatic lesions.  Sigmoid diverticulosis without diverticulitis.  Hepatic cystic disease.  Umbilical hernia.  Imp: 83M with what appears to be High grade T3 transitional cell carcinoma, with local extension of the tumor through the  bladder wall, and acute blood loss secondary to hematuria.    Unclear if patient's hemoglobin has stabilized and if his bleeding has stopped.Marland KitchenHe may need clot evac and resection/fulgartion of bleeding/necrotic tumor in the OR.   I suspect that he may well continue to have on-going intermittent bleeding until his tumor/bladder  is removed entirely.   Rec: Patient will need a chest CT scan to evaluate for metastatic disease, although the current imaging does not show anything more than local extension of the tumor.   In addition, would get oncology to see patient to weigh in on whether patient is candidate for neoadjuvant chemotherapy.  If the tumor responds then cystectomy may be worth considering.  Finally, would continue to trend hemoglobin and if it continues to drop I can add him on to the end of the surgery schedule tomorrow in attempt to gain some control of the  bleeding.  Having discussed the patient's current circumstance he now appears to be more amenable to aggressive treatment.

## 2013-12-16 NOTE — Op Note (Signed)
Preoperative diagnosis:  1. Muscle invasive bladder cancer, clot retention, anemia  Postoperative diagnosis: 1. Same  Procedure(s): 1. Cystoscopy with clot evac 2. TURBT large tumor > 5 cm  Surgeon: Dr. Louis Meckel  Resident: Dr. Amaryllis Dyke  Anesthesia: General  Complications: None  EBL: Minimal  Specimens: Bladder tumor for pathology  Intraoperative findings: Large clot, completely evacuated. Bladder tumor on left lateral wall and dome. Ureteral orifices in orthotopic position away from the resection site.   Indication: 59 year old male with muscle invasive bladder cancer who initially refused treatment. He returned to the hospital after having weakness and fatigue as well as significant gross hematuria and had a hemoglobin of 5.5. He presents to the operating room for cystoscopy, clot evacuation, and palliative transurethral resection of bladder tumor.  Description of procedure:  The patient was correctly identified in the preoperative groin area and brought back to the operating room he was placed supine operating room table. Preinduction timeout was called and general anesthesia was induced. He was then placed in the dorsal lithotomy position with all pressure points padded and prepped and draped in the usual sterile fashion. A pre procedure timeout was called and the case was begun. A rigid cystoscope was placed into the bladder per urethra where a large amount of clot was noted as well as a massive bladder tumor on the left lateral wall and dome. The resectoscope was then advanced into the bladder and the clot was evacuated using a [place]y syringe. Next, the loop resection device was used to resect the large bladder tumor on the left lateral wall and dome. We resected deep enough to remove the bulk of the tumor and control bleeding however we were not intending to reach muscle given the known diagnosis of muscle invasive bladder cancer. We evacuated out all of the bladder  chips and obtained hemostasis. The ureter was clear at the end of the case and there were no additional chips in the bladder and there was no active bleeding. At this point a 24 [place] three-way Foley catheter was advance and the bladder after viscous lidocaine was injected into the urethra. A B&O suppository was administered and the patient was awoken from general anesthesia having tolerated procedure well. He was taken to PACU for recovery and return to the floor for further management by the primary team.  Dr. Louis Meckel was present and scrubbed for the entire procedure.

## 2013-12-16 NOTE — Progress Notes (Signed)
Assumed care of pt at this time. Pt is stable and without complaints. Agree with this AM's assessment. Pt is scheduled for surgery at 4pm. Per RN from day shift pt is ready for surgery with the signed consent on the chart. Will continue to monitor.  Othella Boyer [facility] 12/16/2013 2:58 PM

## 2013-12-16 NOTE — Anesthesia Postprocedure Evaluation (Signed)
  Anesthesia Post-op Note  Patient: Erik Gross  Procedure(s) Performed: Procedure(s) (LRB): TRANSURETHRAL RESECTION OF BLADDER TUMOR AND CLOT EVACUATION (N/A)  Patient Location: PACU  Anesthesia Type: General  Level of Consciousness: awake and alert   Airway and Oxygen Therapy: Patient Spontanous Breathing  Post-op Pain: mild  Post-op Assessment: Post-op Vital signs reviewed, Patient's Cardiovascular Status Stable, Respiratory Function Stable, Patent Airway and No signs of Nausea or vomiting  Last Vitals:  Filed Vitals:   12/16/13 1845  BP: 130/78  Pulse: 97  Temp:   Resp: 22    Post-op Vital Signs: stable   Complications: No apparent anesthesia complications

## 2013-12-16 NOTE — Progress Notes (Signed)
I have made patient NPO and started him on IVF in the event that his hgb is lower this AM, at which point we would plan to take patient to the OR this PM for clot evac and fulguration.

## 2013-12-16 NOTE — Op Note (Signed)
I was scrubbed and present for the entire operation. Agree with the above as documented.

## 2013-12-16 NOTE — Progress Notes (Signed)
Patient ID: Erik Gross, male   DOB: 04/29/1955, 58 y.o.   MRN: [id number]  [facility] PROGRESS NOTE  Erik Gross PJK:[id number] DOB: May 17, 1955 DOA: 12/14/2013 PCP: Rusty Aus., MD  Brief narrative: 58 year old very pleasant gentleman with nvasive bladder cancer diagnosed in December 2014, underwent TURBT and post operative Mitomycin-C . He presented to [facility] for evaluation of hematuria, progressive weakness. On admission, blood work notable for Hg 5.5 and CT scan showed 5.3 x 4.0 x 5.6 cm diameter irregular enhancing mass at the the left side of the bladder, c/w bladder neoplasm with probable direct extension of tumor into the prevesical space.  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 Alen Blew assistance - per Dr. Alen Blew, despite the progression of disease pt still has a reasonable chance of cure - neoadjuvant chemotherapy is the standard of care in this particular setting - plan for cystoscopy and evacuation of a clot possibly fulguration of a bleeding tumor on 12/16/2013.  Iron deficiency anemia - status post packed red cell transfusions with a hemoglobin up to 8.6 - repeat CBC in AM  DVT prophylaxis  SCD's  Code Status: Full Family Communication: Pt at bedside Disposition Plan: Home when medically stable   IV Access:   Peripheral IV Procedures and diagnostic studies:   Ct Abdomen Pelvis W Contrast 12/14/2013   5.3 x 4.0 x 5.6 cm diameter irregular enhancing mass at the the LEFT lateral aspect of the urinary bladder compatible with bladder neoplasm with probable direct extension of tumor into the prevesical space.  Small amount of blood clot or less likely an additional tumor nodule dependently in bladder.  Bladder tumor extends to near the LEFT ureterovesical junction without causing LEFT hydronephrosis or ureteral dilatation.  No definite adenopathy or metastatic lesions.   Sigmoid diverticulosis without diverticulitis.  Hepatic cystic disease.   Medical Consultants:   Urology Oncology  Other Consultants:   None   Anti-Infectives:   None   Faye Ramsay, MD  [facility] Pager [phone]  If 7PM-7AM, please contact night-coverage [email or web address] Password [email or web address] 12/16/2013, 3:21 PM   LOS: 2 days   HPI/Subjective: No events overnight.   Objective: Filed Vitals:   12/15/13 1550 12/15/13 2132 12/16/13 0450 12/16/13 1351  BP: 116/61 98/46 122/71 129/61  Pulse: 89 87 87 85  Temp: 98.6 F (37 C) 99.6 F (37.6 C) 99.3 F (37.4 C) 98 F (36.7 C)  TempSrc: Oral Oral Oral Oral  Resp: 18 18 18 20   Height:      Weight:      SpO2: 100% 97% 98% 99%    Intake/Output Summary (Last 24 hours) at 12/16/13 1521 Last data filed at 12/16/13 1458  Gross per 24 hour  Intake   1795 ml  Output    280 ml  Net   1515 ml    Exam:   General:  Pt is alert, follows commands appropriately, not in acute distress  Cardiovascular: Regular rate and rhythm, S1/S2, no murmurs, no rubs, no gallops  Respiratory: Clear to auscultation bilaterally, no wheezing, no crackles, no rhonchi  Abdomen: Soft, non tender, non distended, bowel sounds present, no guarding  Data Reviewed: Basic Metabolic Panel:  Recent Labs Lab 12/14/13 1408 12/15/13 0550 12/16/13 0605  NA 136* 138 136*  K 3.7 3.9 3.9  CL 100 105 102  CO2 23 21 21   GLUCOSE 99 103* 113*  BUN 12 9 10  CREATININE 1.11 0.95 0.95  CALCIUM 8.5 8.1* 8.3*   Liver Function Tests:  Recent Labs Lab 12/14/13 1408 12/15/13 0550  AST 12 10  ALT 8 7  ALKPHOS 53 46  BILITOT 0.2* 0.4  PROT 6.7 5.9*  ALBUMIN 3.4* 2.9*   CBC:  Recent Labs Lab 12/14/13 1408 12/15/13 0120 12/15/13 0550 12/15/13 1800 12/16/13 0605  WBC 7.7  --  5.9  --  7.5  HGB 5.5* 7.2* 6.6* 8.4* 8.6*  HCT 18.3* 22.6* 21.2* 26.6* 26.5*  MCV 74.4*  --  76.0*  --  76.6*  PLT 282  --  224  --  231    Recent Results (from the past 240  hour(s))  SURGICAL PCR SCREEN     Status: None   Collection Time    12/16/13 10:23 AM      Result Value Ref Range Status   MRSA, PCR NEGATIVE  NEGATIVE Final   Staphylococcus aureus NEGATIVE  NEGATIVE Final   Comment:            The Xpert SA Assay (FDA     approved for NASAL specimens     in patients over 73 years of age),     is one component of     a comprehensive surveillance     program.  Test performance has     been validated by [employer] for patients greater     than or equal to 61 year old.     It is not intended     to diagnose infection nor to     guide or monitor treatment.     Scheduled Meds: . sodium chloride   Intravenous Once   Continuous Infusions: . dextrose 5 % and 0.45 % NaCl with KCl 20 mEq/L 100 mL/hr at 12/16/13 0603

## 2013-12-16 NOTE — Transfer of Care (Signed)
Immediate Anesthesia Transfer of Care Note  Patient: Erik Gross  Procedure(s) Performed: Procedure(s): TRANSURETHRAL RESECTION OF BLADDER TUMOR AND CLOT EVACUATION (N/A)  Patient Location: PACU  Anesthesia Type:General  Level of Consciousness: awake, sedated and patient cooperative  Airway & Oxygen Therapy: Patient Spontanous Breathing and Patient connected to face mask oxygen  Post-op Assessment: Report given to PACU RN and Post -op Vital signs reviewed and stable  Post vital signs: Reviewed and stable  Complications: No apparent anesthesia complications

## 2013-12-16 NOTE — Consult Note (Signed)
Reason for Referral: Bladder cancer.   HPI: 58 year old gentleman who is relatively healthy except for a history documented invasive bladder cancer diagnosed in December 2014. He was hospitalized on 12/14/2013 after he presented with weakness, fatigue and hematuria. His history of bladder cancer dates back to November of 2014 where he presented with hematuria and lower urinary tract symptoms. He underwent a TURBT and postoperative mitomycin-C on 02/25/2013. Retrograde pyelograms were negative at that time. Patient refused intervention at that time. During this hospitalization, he was found to have hemoglobin of 6.6 with iron level less than 10. His creatinine was 0.95 without any other metabolic derangements. CT scan on 12/14/2013 showed 5.3 x 4.0 x 5.6 cm diameter irregular enhancing mass at the the left lateral aspect of the urinary bladder compatible with bladder neoplasm with probable direct extension of tumor into the prevesical space. I was asked to comment on these findings. Histopathology from December of 2014 showed invasive high-grade urothelial carcinoma invading into muscularis propria at two separate locations.  Clinically, he is feeling better this morning. He is not reporting any headaches or blurry vision or syncope. He does not report any fevers or chills or sweats or loss. He does not report any abdominal pain constipation or diarrhea. He does not report any chest pain or palpitation or orthopnea. He did not report any wheezing or cough or hemoptysis. He continues to have hematuria and some clot retention at times. He did not report any flank pain or pelvic pain. He does not report any lower extremity edema or change in his mentation. He does not report any skeletal complaints. He continues to be in excellent health and excellent performance status. Rest of his review of systems unremarkable.   Past Medical History  Diagnosis Date  . Bladder tumor   . Nocturia   . Arthritis   . Wears  glasses   . Cancer   :  Past Surgical History  Procedure Laterality Date  . Lumbar disc surgery  01-17-2013  . Inguinal hernia repair Right 1994  . Transurethral resection of bladder tumor N/A 02/25/2013    Procedure: TRANSURETHRAL RESECTION OF BLADDER TUMOR (TURBT) WITH POST OP MITOMYCIN-C;  Surgeon: Ardis Hughs, MD;  Location: [facility];  Service: Urology;  Laterality: N/A;  . Cystoscopy w/ retrogrades Bilateral 02/25/2013    Procedure: BILATERAL RETROGRADE PYELOGRAM;  Surgeon: Ardis Hughs, MD;  Location: [facility];  Service: Urology;  Laterality: Bilateral;  :  Current Facility-Administered Medications  Medication Dose Route Frequency Provider Last Rate Last Dose  . 0.9 %  sodium chloride infusion   Intravenous Once Thurnell Lose, MD      . acetaminophen (TYLENOL) tablet 650 mg  650 mg Oral Q6H PRN Domenic Polite, MD      . dextrose 5 % and 0.45 % NaCl with KCl 20 mEq/L infusion   Intravenous Continuous Thurnell Lose, MD 100 mL/hr at 12/16/13 0603    . diphenhydrAMINE (BENADRYL) injection 25 mg  25 mg Intravenous Q6H PRN Thurnell Lose, MD      . ondansetron (ZOFRAN) injection 4 mg  4 mg Intravenous Q6H PRN Domenic Polite, MD           No Known Allergies:   family history: Mother had lung cancer.   History   Social History  . Marital Status: Married    Spouse Name: N/A    Number of Children: N/A  . Years of Education: N/A   Occupational History  .  Not on file.   Social History Main Topics  . Smoking status: Former Smoker -- 2.00 packs/day for 30 years    Types: Cigarettes    Quit date: 02/24/2004  . Smokeless tobacco: Never Used  . Alcohol Use: No  . Drug Use: No  . Sexual Activity: Not on file   Other Topics Concern  . Not on file   Social History Narrative  . No narrative on file  :  Pertinent items are noted in HPI.  Exam: Blood pressure 122/71, pulse 87, temperature 99.3 F (37.4 C),  temperature source Oral, resp. rate 18, height 5\' 6"  (1.676 m), weight 198 lb 4.8 oz (89.948 kg), SpO2 98.00%. General appearance: alert and cooperative Head: Normocephalic, without obvious abnormality Throat: lips, mucosa, and tongue normal; teeth and gums normal Neck: no adenopathy Back: symmetric, no curvature. ROM normal. No CVA tenderness. Resp: clear to auscultation bilaterally Chest wall: no tenderness Cardio: regular rate and rhythm, S1, S2 normal, no murmur, click, rub or gallop GI: soft, non-tender; bowel sounds normal; no masses,  no organomegaly Extremities: extremities normal, atraumatic, no cyanosis or edema Pulses: 2+ and symmetric Skin: Skin color, texture, turgor normal. No rashes or lesions Lymph nodes: Cervical, supraclavicular, and axillary nodes normal.   Recent Labs  12/15/13 0550 12/15/13 1800 12/16/13 0605  WBC 5.9  --  7.5  HGB 6.6* 8.4* 8.6*  HCT 21.2* 26.6* 26.5*  PLT 224  --  231    Recent Labs  12/15/13 0550 12/16/13 0605  NA 138 136*  K 3.9 3.9  CL 105 102  CO2 21 21  GLUCOSE 103* 113*  BUN 9 10  CREATININE 0.95 0.95  CALCIUM 8.1* 8.3*      Ct Abdomen Pelvis W Contrast  12/14/2013   CLINICAL DATA:  Increasing hematuria over past couple weeks, shortness of breath, weakness, now with hemoglobin of 5 g, history of bladder cancer 1 year ago  EXAM: CT ABDOMEN AND PELVIS WITH CONTRAST  TECHNIQUE: Multidetector CT imaging of the abdomen and pelvis was performed using the standard protocol following bolus administration of intravenous contrast. Sagittal and coronal MPR images reconstructed from axial data set.  CONTRAST:  55mL OMNIPAQUE IOHEXOL 300 MG/ML SOLN IV. Dilute oral contrast.  COMPARISON:  None.  FINDINGS: Lung bases clear.  Multiple hepatic cysts largest 18 x 17 mm.  Tiny calcified granulomata within spleen.  Liver, spleen, pancreas, kidneys and adrenal glands otherwise normal appearance.  No hydronephrosis, ureteral calcification or  ureteral dilatation.  Abnormal appearance of the urinary bladder with an enhancing irregular soft tissue mass at the LEFT lateral aspect measuring 5.3 x 4.0 x 5.6 cm in size.  This most likely represents a bladder neoplasm.  Infiltration of adjacent fat planes in prevesical space is concerning for direct tumor extension.  Tumor extends to near the LEFT ureterovesical junction without obstruction/ureteral dilatation.  Dependent intermediate attenuation within the bladder could represent a second tumor nodule or dependent blood clot 2.6 x 1.8 x 1.7 cm.  No definite pelvic adenopathy or perivesical tumor nodularity.  Prostate gland and seminal vesicles grossly unremarkable.  Mild hyperemia in LEFT perivesical soft tissues.  Normal appendix.  Scattered colonic diverticulosis greatest at sigmoid colon.  Stomach and bowel loops otherwise normal appearance.  No additional mass, adenopathy, free fluid, or free air.  Small umbilical hernia containing fat.  No definite osseous metastatic lesions.  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  IMPRESSION: 5.3 x  4.0 x 5.6 cm diameter irregular enhancing mass at the the LEFT lateral aspect of the urinary bladder compatible with bladder neoplasm with probable direct extension of tumor into the prevesical space.  Small amount of blood clot or less likely an additional tumor nodule dependently in bladder.  Bladder tumor extends to near the LEFT ureterovesical junction without causing LEFT hydronephrosis or ureteral dilatation.  No definite adenopathy or metastatic lesions.  Sigmoid diverticulosis without diverticulitis.  Hepatic cystic disease.  Umbilical hernia.  Findings called to Dr. Sabra Heck on 12/14/2013 at 1553 hours.   Electronically Signed   By: Lavonia Dana M.D.   On: 12/14/2013 15:54    Assessment and Plan:    58 year old gentleman with the following issues:  1. 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 He has a 5.3 x 4.0 x 5.6 cm diameter mass at the left aspect of the urinary bladder. This was initially noted back in December of 2014 the patient declined intervention. Now he presents with hematuria in progression of his disease. He does not have any evidence to suggest metastatic disease at this time in the abdomen and pelvis.  Natural course of this disease was discussed with the patient today and explained to him that despite his progression of disease he still has a reasonable chance of cure. However, this window is closing and he needs intervention sooner rather than later. I do agree with Dr. Louis Meckel that neoadjuvant chemotherapy is the standard of care in this particular setting. I think he would be an excellent candidate for combination cisplatin and Gemzar for a total of 3-4 cycles followed by cystectomy if he has good response. He understands that this is his only chance of cure otherwise this disease will certainly will be lethal.  I agree with her current management and he is scheduled to have cystoscopy and evacuation of a clot possibly fulguration of a bleeding tumor on 12/16/2013. Upon completing this procedure, and discharged from the hospital I will arrange a followup in the outpatient setting to initiate systemic chemotherapy if he is willing at that time.  2. Iron deficiency anemia: He is status post packed red cell transfusions with a hemoglobin up to 8.6. He will certainly benefit from IV iron replacement which can also be done as an outpatient setting.  All his questions are answered today my contact information was given to the patient and my office will followup with an outpatient appointments in the near future.

## 2013-12-16 NOTE — Interval H&P Note (Signed)
History and Physical Interval Note: Patient continues to have gross hematuria pass clots. In the interest of clearing of his hematuria a decision was made to proceed to the operating room for cystoscopy, clot evacuation, resection of the necrotic bladder tumor.  Patient understands that by no means will l be able to resect the entire bladder tumor but will do what I can to help control the bleeding.  12/16/2013 4:47 PM  Erik Gross  has presented today for surgery, with the diagnosis of clot  The various methods of treatment have been discussed with the patient and family. After consideration of risks, benefits and other options for treatment, the patient has consented to  Procedure(s): TRANSURETHRAL RESECTION OF BLADDER TUMOR AND CLOT EVACUATION (N/A) as a surgical intervention .  The patient's history has been reviewed, patient examined, no change in status, stable for surgery.  I have reviewed the patient's chart and labs.  Questions were answered to the patient's satisfaction.     Louis Meckel W

## 2013-12-17 ENCOUNTER — Encounter (HOSPITAL_COMMUNITY): Payer: Self-pay | Admitting: Urology

## 2013-12-17 DIAGNOSIS — D494 Neoplasm of unspecified behavior of bladder: Secondary | ICD-10-CM

## 2013-12-17 LAB — CBC
HCT: 25.7 % — ABNORMAL LOW (ref 39.0–52.0)
Hemoglobin: 8 g/dL — ABNORMAL LOW (ref 13.0–17.0)
MCH: 24.8 pg — ABNORMAL LOW (ref 26.0–34.0)
MCHC: 31.1 g/dL (ref 30.0–36.0)
MCV: 79.6 fL (ref 78.0–100.0)
PLATELETS: 210 10*3/uL (ref 150–400)
RBC: 3.23 MIL/uL — ABNORMAL LOW (ref 4.22–5.81)
RDW: 18.8 % — ABNORMAL HIGH (ref 11.5–15.5)
WBC: 8.2 10*3/uL (ref 4.0–10.5)

## 2013-12-17 LAB — BASIC METABOLIC PANEL
ANION GAP: 12 (ref 5–15)
BUN: 8 mg/dL (ref 6–23)
CHLORIDE: 101 meq/L (ref 96–112)
CO2: 22 mEq/L (ref 19–32)
CREATININE: 0.98 mg/dL (ref 0.50–1.35)
Calcium: 8.2 mg/dL — ABNORMAL LOW (ref 8.4–10.5)
GFR, EST NON AFRICAN AMERICAN: 88 mL/min — AB (ref >90)
Glucose, Bld: 119 mg/dL — ABNORMAL HIGH (ref 70–99)
Potassium: 3.8 mEq/L (ref 3.7–5.3)
Sodium: 135 mEq/L — ABNORMAL LOW (ref 137–147)

## 2013-12-17 MED ORDER — HYDRALAZINE HCL 20 MG/ML IJ SOLN: 5.0000 mg | INTRAMUSCULAR | Status: DC | PRN

## 2013-12-17 MED ORDER — DOCUSATE SODIUM 100 MG PO CAPS: 100.0000 mg | ORAL_CAPSULE | Freq: Two times a day (BID) | ORAL | Status: AC | PRN

## 2013-12-17 MED ORDER — OXYCODONE HCL 5 MG PO TABS
5.0000 mg | ORAL_TABLET | ORAL | Status: DC | PRN
Start: 2013-12-17 — End: 2014-04-24

## 2013-12-17 MED ORDER — ZOLPIDEM TARTRATE 5 MG PO TABS: 5.0000 mg | ORAL_TABLET | Freq: Every evening | ORAL | Status: DC | PRN

## 2013-12-17 MED ORDER — FESOTERODINE FUMARATE ER 4 MG PO TB24: 4.0000 mg | ORAL_TABLET | Freq: Every day | ORAL | Status: DC

## 2013-12-17 MED ORDER — BELLADONNA ALKALOIDS-OPIUM 16.2-60 MG RE SUPP: 1.0000 | Freq: Four times a day (QID) | RECTAL | Status: DC | PRN

## 2013-12-17 NOTE — Progress Notes (Signed)
Patient urine output  pinkish with little/small clot, notified Dr. Louis Meckel and he stated to D/C foley and discharge patient.

## 2013-12-17 NOTE — Discharge Summary (Signed)
Physician Discharge Summary  Erik Gross QQI:[id number] DOB: Nov 28, 1955 DOA: 12/14/2013  PCP: Rusty Aus., MD  Admit date: 12/14/2013 Discharge date: 12/17/2013  Recommendations for Outpatient Follow-up:  1. Pt will need to follow up with PCP in 2-3 weeks post discharge  Discharge Diagnoses:  Active Problems:   Anemia   Hematuria   Bladder cancer  Discharge Condition: Stable  Diet recommendation: Heart healthy diet discussed in details   Brief narrative:  58 year old very pleasant gentleman with nvasive bladder cancer diagnosed in December 2014, underwent TURBT and post operative Mitomycin-C . He presented to [facility] for evaluation of hematuria, progressive weakness. On admission, blood work notable for Hg 5.5 and CT scan showed 5.3 x 4.0 x 5.6 cm diameter irregular enhancing mass at the the left side of the bladder, c/w bladder neoplasm with probable direct extension of tumor into the prevesical space.   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 Alen Blew assistance  - per Dr. Alen Blew, despite the progression of disease pt still has a reasonable chance of cure  - neoadjuvant chemotherapy is the standard of care in this particular setting   - 1 Day Post-Op s/p clot evac/TURBT, doing well  - Will send patient home with OAB medicine, pain meds and stool softeners.  Iron deficiency anemia  - status post packed red cell transfusions with a hemoglobin up to 8.6  - repeat CBC in AM    Code Status: Full  Family Communication: Pt at bedside  Disposition Plan: Home when medically stable   IV Access:   Peripheral IV Procedures and diagnostic studies:   Ct Abdomen Pelvis W Contrast 12/14/2013 5.3 x 4.0 x 5.6 cm diameter irregular enhancing mass at the the LEFT lateral aspect of the urinary bladder compatible with bladder neoplasm with probable direct extension of tumor into the prevesical space.  Small amount of blood clot or less likely an additional tumor nodule dependently in bladder. Bladder tumor extends to near the LEFT ureterovesical junction without causing LEFT hydronephrosis or ureteral dilatation. No definite adenopathy or metastatic lesions. Sigmoid diverticulosis without diverticulitis. Hepatic cystic disease.  Medical Consultants:   Urology  Oncology  Other Consultants:   None  Anti-Infectives:   None   Discharge Exam: Filed Vitals:   12/17/13 0505  BP: 119/64  Pulse: 86  Temp: 99.7 F (37.6 C)  Resp: 18   Filed Vitals:   12/16/13 1953 12/16/13 2129 12/16/13 2214 12/17/13 0505  BP: 119/79 118/76 116/65 119/64  Pulse: 80 83 82 86  Temp: 98.8 F (37.1 C) 98.6 F (37 C) 99.1 F (37.3 C) 99.7 F (37.6 C)  TempSrc:  Oral Oral Oral  Resp: 14 18 18 18   Height:      Weight:      SpO2: 100% 99% 99% 96%    General: Pt is alert, follows commands appropriately, not in acute distress Cardiovascular: Regular rate and rhythm, S1/S2 +, no murmurs, no rubs, no gallops Respiratory: Clear to auscultation bilaterally, no wheezing, no crackles, no rhonchi Abdominal: Soft, non tender, non distended, bowel sounds +, no guarding  Discharge Instructions  Discharge Instructions   Diet - low sodium heart healthy    Complete by:  As directed      Increase activity slowly    Complete by:  As directed             Medication List  aspirin EC 81 MG tablet  Take by mouth daily.     docusate sodium 100 MG capsule  Commonly known as:  COLACE  Take 1 capsule (100 mg total) by mouth 2 (two) times daily as needed (take to keep stool soft.).     fesoterodine 4 MG Tb24 tablet  Commonly known as:  TOVIAZ  Take 1 tablet (4 mg total) by mouth daily.     multivitamin tablet  Take 1 tablet by mouth daily.     opium-belladonna 16.2-60 MG suppository  Commonly known as:  B&O SUPPRETTES  Place 1 suppository rectally every 6 (six) hours as needed for bladder spasms.      oxyCODONE 5 MG immediate release tablet  Commonly known as:  ROXICODONE  Take 1 tablet (5 mg total) by mouth every 4 (four) hours as needed.     TURMERIC PO  Take 1 tablet by mouth daily.     zolpidem 5 MG tablet  Commonly known as:  AMBIEN  Take 1 tablet (5 mg total) by mouth at bedtime as needed for sleep.           Follow-up Information   Schedule an appointment as soon as possible for a visit with Rusty Aus., MD.   Specialty:  Internal Medicine   Contact information:   [place] [address] [phone]       Schedule an appointment as soon as possible for a visit with [facility], MD.   Specialty:  Oncology   Contact information:   [address] [place] [address] [phone]       Follow up with Faye Ramsay, MD. (As needed call my cell phone [phone])    Specialty:  Internal Medicine   Contact information:   [address] [place] [address] [phone]        The results of significant diagnostics from this hospitalization (including imaging, microbiology, ancillary and laboratory) are listed below for reference.     Microbiology: Recent Results (from the past 240 hour(s))  SURGICAL PCR SCREEN     Status: None   Collection Time    12/16/13 10:23 AM      Result Value Ref Range Status   MRSA, PCR NEGATIVE  NEGATIVE Final   Staphylococcus aureus NEGATIVE  NEGATIVE Final   Comment:            The Xpert SA Assay (FDA     approved for NASAL specimens     in patients over 60 years of age),     is one component of     a comprehensive surveillance     program.  Test performance has     been validated by [employer] for patients greater     than or equal to 33 year old.     It is not intended     to diagnose infection nor to     guide or monitor treatment.     Labs: Basic Metabolic Panel:  Recent Labs Lab 12/14/13 1408 12/15/13 0550 12/16/13 0605 12/17/13 0444  NA 136*  138 136* 135*  K 3.7 3.9 3.9 3.8  CL 100 105 102 101  CO2 23 21 21 22   GLUCOSE 99 103* 113* 119*  BUN 12 9 10 8   CREATININE 1.11 0.95 0.95 0.98  CALCIUM 8.5 8.1* 8.3* 8.2*   Liver Function Tests:  Recent Labs Lab 12/14/13 1408 12/15/13 0550  AST 12 10  ALT  8 7  ALKPHOS 53 46  BILITOT 0.2* 0.4  PROT 6.7 5.9*  ALBUMIN 3.4* 2.9*   CBC:  Recent Labs Lab 12/14/13 1408 12/15/13 0120 12/15/13 0550 12/15/13 1800 12/16/13 0605 12/17/13 0444  WBC 7.7  --  5.9  --  7.5 8.2  HGB 5.5* 7.2* 6.6* 8.4* 8.6* 8.0*  HCT 18.3* 22.6* 21.2* 26.6* 26.5* 25.7*  MCV 74.4*  --  76.0*  --  76.6* 79.6  PLT 282  --  224  --  231 210   SIGNED: Time coordinating discharge: Over 30 minutes  Faye Ramsay, MD  [facility] 12/17/2013, 12:05 PM Pager [phone]  If 7PM-7AM, please contact night-coverage [email or web address] Password [email or web address]

## 2013-12-17 NOTE — Discharge Instructions (Signed)

## 2013-12-17 NOTE — Progress Notes (Signed)
Patient discharged home with wife, discharge instructions given and explained to patient/wife and they verbalized understanding, denies any pain/distress, patient voided after foley was D/C'd. No wound noted, skin intact. Accompanied home by wife.

## 2013-12-17 NOTE — Progress Notes (Signed)
Urology Inpatient Progress Report  Intv/Subj: No acute events overnight. Patient is without complaint. CBI clear on light gtt  Past Medical History  Diagnosis Date  . Bladder tumor   . Nocturia   . Arthritis   . Wears glasses   . Cancer    Current Facility-Administered Medications  Medication Dose Route Frequency Provider Last Rate Last Dose  . acetaminophen (TYLENOL) tablet 650 mg  650 mg Oral Q6H PRN Domenic Polite, MD      . dextrose 5 % and 0.45 % NaCl with KCl 20 mEq/L infusion   Intravenous Continuous Thurnell Lose, MD 100 mL/hr at 12/17/13 0503    . diphenhydrAMINE (BENADRYL) injection 25 mg  25 mg Intravenous Q6H PRN Thurnell Lose, MD      . docusate sodium (COLACE) capsule 100 mg  100 mg Oral BID Ardis Hughs, MD   100 mg at 12/16/13 2126  . menthol-cetylpyridinium (CEPACOL) lozenge 3 mg  1 lozenge Oral PRN Ardis Hughs, MD      . morphine 2 MG/ML injection 2 mg  2 mg Intravenous Q1H PRN Ardis Hughs, MD      . neomycin-bacitracin-polymyxin (NEOSPORIN) ointment 1 application  1 application Topical TID PRN Ardis Hughs, MD      . ondansetron [facility]) injection 4 mg  4 mg Intravenous Q6H PRN Domenic Polite, MD      . opium-belladonna (B&O SUPPRETTES) suppository 1 suppository  1 suppository Rectal Q6H PRN Ardis Hughs, MD      . oxybutynin [facility]) 5 MG/5ML syrup 5 mg  5 mg Oral BID Allie Bossier, MD   5 mg at 12/16/13 2147  . oxyCODONE (Oxy IR/ROXICODONE) immediate release tablet 5-10 mg  5-10 mg Oral Q4H PRN Ardis Hughs, MD   5 mg at 12/17/13 0505  . phenol (CHLORASEPTIC) mouth spray 1 spray  1 spray Mouth/Throat PRN Ardis Hughs, MD      . zolpidem [facility]) tablet 5 mg  5 mg Oral QHS PRN Ardis Hughs, MD         Objective: Vital: Filed Vitals:   12/16/13 1953 12/16/13 2129 12/16/13 2214 12/17/13 0505  BP: 119/79 118/76 116/65 119/64  Pulse: 80 83 82 86  Temp: 98.8 F (37.1 C) 98.6 F (37 C) 99.1 F (37.3 C)  99.7 F (37.6 C)  TempSrc:  Oral Oral Oral  Resp: 14 18 18 18   Height:      Weight:      SpO2: 100% 99% 99% 96%   I/Os: I/O last 3 completed shifts: In: 6673.3 [P.O.:480; I.V.:3193.3; Other:3000] Out: 2880 [Urine:2880]  Physical Exam:  General: Patient is in no apparent distress Lungs: Normal respiratory effort, chest expands symmetrically. Foley is draining light pink on slow CBI gtt Ext: lower extremities symmetric  Lab Results:  Recent Labs  12/15/13 0550 12/15/13 1800 12/16/13 0605 12/17/13 0444  WBC 5.9  --  7.5 8.2  HGB 6.6* 8.4* 8.6* 8.0*  HCT 21.2* 26.6* 26.5* 25.7*    Recent Labs  12/15/13 0550 12/16/13 0605 12/17/13 0444  NA 138 136* 135*  K 3.9 3.9 3.8  CL 105 102 101  CO2 21 21 22   GLUCOSE 103* 113* 119*  BUN 9 10 8   CREATININE 0.95 0.95 0.98  CALCIUM 8.1* 8.3* 8.2*    Recent Labs  12/15/13 0550  INR 1.11   No results found for this basename: LABURIN,  in the last 72 hours Results for orders placed during  the hospital encounter of 12/14/13  SURGICAL PCR SCREEN     Status: None   Collection Time    12/16/13 10:23 AM      Result Value Ref Range Status   MRSA, PCR NEGATIVE  NEGATIVE Final   Staphylococcus aureus NEGATIVE  NEGATIVE Final   Comment:            The Xpert SA Assay (FDA     approved for NASAL specimens     in patients over 15 years of age),     is one component of     a comprehensive surveillance     program.  Test performance has     been validated by [employer] for patients greater     than or equal to 76 year old.     It is not intended     to diagnose infection nor to     guide or monitor treatment.    Studies/Results:   Assessment: 1 Day Post-Op s/p clot evac/TURBT, doing well  Plan: Recommend stopping CBI and giving patient voiding trial if urine remains clear off CBI. If voiding without issue he can be discharged from my perspective. Will send patient home with OAB medicine, pain meds and stool  softeners.  Will need to f/u with Dr Alen Blew to begin chemotherapy.  Louis Meckel W 12/17/2013, 7:14 AM

## 2013-12-22 ENCOUNTER — Telehealth: Payer: Self-pay | Admitting: Oncology

## 2013-12-22 NOTE — Telephone Encounter (Signed)
C/D 12/22/13 for appt.12/29/13

## 2013-12-22 NOTE — Telephone Encounter (Signed)
S/W PATIENT AND GAVE HOSP F/U FOR 10/13 @ 10:30 W/DR. SHADAD.

## 2013-12-29 ENCOUNTER — Other Ambulatory Visit: Payer: BC Managed Care – PPO

## 2013-12-29 ENCOUNTER — Encounter: Payer: Self-pay | Admitting: Oncology

## 2013-12-29 ENCOUNTER — Telehealth: Payer: Self-pay | Admitting: Oncology

## 2013-12-29 ENCOUNTER — Ambulatory Visit (HOSPITAL_BASED_OUTPATIENT_CLINIC_OR_DEPARTMENT_OTHER): Payer: BC Managed Care – PPO

## 2013-12-29 ENCOUNTER — Telehealth: Payer: Self-pay | Admitting: *Deleted

## 2013-12-29 ENCOUNTER — Ambulatory Visit (HOSPITAL_BASED_OUTPATIENT_CLINIC_OR_DEPARTMENT_OTHER): Payer: BC Managed Care – PPO | Admitting: Oncology

## 2013-12-29 VITALS — BP 122/74 | HR 75 | Temp 97.6°F | Resp 18 | Ht 66.0 in | Wt 196.3 lb

## 2013-12-29 DIAGNOSIS — D500 Iron deficiency anemia secondary to blood loss (chronic): Secondary | ICD-10-CM

## 2013-12-29 DIAGNOSIS — C679 Malignant neoplasm of bladder, unspecified: Secondary | ICD-10-CM

## 2013-12-29 MED ORDER — LIDOCAINE-PRILOCAINE 2.5-2.5 % EX CREA: 1.0000 "application " | TOPICAL_CREAM | CUTANEOUS | Status: DC | PRN

## 2013-12-29 MED ORDER — LIDOCAINE-PRILOCAINE 2.5-2.5 % EX CREA: 1.0000 "application " | TOPICAL_CREAM | CUTANEOUS | Status: AC | PRN

## 2013-12-29 MED ORDER — ONDANSETRON HCL 8 MG PO TABS: 8.0000 mg | ORAL_TABLET | Freq: Once | ORAL | Status: DC

## 2013-12-29 MED ORDER — ONDANSETRON HCL 8 MG PO TABS: 8.0000 mg | ORAL_TABLET | Freq: Three times a day (TID) | ORAL | Status: DC | PRN

## 2013-12-29 NOTE — Telephone Encounter (Signed)
Per staff message and POF I have scheduled appts. Advised scheduler of appts. JMW  

## 2013-12-29 NOTE — Telephone Encounter (Signed)
, °

## 2013-12-29 NOTE — Progress Notes (Signed)
Patient brought in FMLA papers to be completed. FMLA documents placed in [place], managed care, inbox. Patient requesting that papers be mailed back to him.

## 2013-12-29 NOTE — Progress Notes (Signed)
Hematology and Oncology Follow Up Visit  Erik Gross [id number] 1955/04/21 58 y.o. 12/29/2013 11:21 AM Erik Gross F., MDMiller, Christean Grief, MD   Principle Diagnosis: 57 year old transitional cell carcinoma of the urinary bladder with muscle invasive disease and at least T3 lesion. He has a 5.3 x 4.0 x 5.6 cm diameter mass at the left aspect of the urinary bladder. This was initially noted back in December of 2014    Prior Therapy:  He underwent a TURBT and postoperative mitomycin-C on 02/25/2013. Retrograde pyelograms were negative at that time. He underwent a repeat TURBT of a more than a 5 cm large tumor and clot evacuation on 12/16/2013. The pathology showed a high-grade invasive transitional cell carcinoma.  Current therapy: Under evaluation for systemic therapy.  Interim History: Erik Gross presents today for a followup visit. He is a pleasant gentleman that I saw in consultation during his recent hospitalization on 12/16/2013. He was hospitalized for her anemia and hematuria from his bladder tumor. Since his discharge, he has felt a lot better. He still slightly fatigued and reporting constipation related to iron supplements. He is no longer reporting any hematuria but does report frequency. He is regaining most activities of daily living. He is not reporting any headaches or blurry vision or syncope. He does not report any fevers or chills or sweats or loss. He does not report any abdominal pain. He does not report any chest pain or palpitation or orthopnea. He did not report any wheezing or cough or hemoptysis.  He did not report any flank pain or pelvic pain. He does not report any lower extremity edema or change in his mentation. He does not report any skeletal complaints. He continues to be in excellent health and excellent performance status. Rest of his review of systems unremarkable.   Medications: I have reviewed the patient's current medications.  Current Outpatient Prescriptions   Medication Sig Dispense Refill  . acetaminophen (TYLENOL) 500 MG tablet Take by mouth.      . bisacodyl (STIMULANT LAXATIVE) 5 MG EC tablet Take by mouth.      . calcium carbonate (TUMS - DOSED IN MG ELEMENTAL CALCIUM) 500 MG chewable tablet Chew 1 tablet by mouth daily.      . ciprofloxacin (CIPRO) 500 MG tablet       . docusate sodium (COLACE) 100 MG capsule Take 1 capsule (100 mg total) by mouth 2 (two) times daily as needed (take to keep stool soft.).  60 capsule  0  . ferrous sulfate 325 (65 FE) MG tablet Take by mouth.      . fesoterodine (TOVIAZ) 4 MG TB24 tablet Take 1 tablet (4 mg total) by mouth daily.  30 tablet  3  . Multiple Vitamin (MULTIVITAMIN) tablet Take 1 tablet by mouth daily.      Marland Kitchen oxyCODONE (ROXICODONE) 5 MG immediate release tablet Take 1 tablet (5 mg total) by mouth every 4 (four) hours as needed.  15 tablet  0  . TURMERIC PO Take 1 tablet by mouth daily.      . Wheat Dextrin (BENEFIBER DRINK MIX PO) Take by mouth.      . zolpidem (AMBIEN) 5 MG tablet Take 1 tablet (5 mg total) by mouth at bedtime as needed for sleep.  30 tablet  0  . lidocaine-prilocaine (EMLA) cream Apply 1 application topically as needed. Apply to port on the day of chemotherapy.  30 g  0  . ondansetron (ZOFRAN) 8 MG tablet Take 1 tablet (8 mg  total) by mouth every 8 (eight) hours as needed for nausea or vomiting.  20 tablet  0  . opium-belladonna (B&O SUPPRETTES) 16.2-60 MG suppository Place 1 suppository rectally every 6 (six) hours as needed for bladder spasms.  45 suppository  0   No current facility-administered medications for this visit.     Allergies: No Known Allergies  Past Medical History, Surgical history, Social history, and Family History were reviewed and updated.   Physical Exam: Blood pressure 122/74, pulse 75, temperature 97.6 F (36.4 C), temperature source Oral, resp. rate 18, height 5\' 6"  (1.676 m), weight 196 lb 4.8 oz (89.041 kg), SpO2 100.00%. ECOG: 0 General  appearance: alert and cooperative Head: Normocephalic, without obvious abnormality Neck: no adenopathy Lymph nodes: Cervical, supraclavicular, and axillary nodes normal. Heart:regular rate and rhythm, S1, S2 normal, no murmur, click, rub or gallop Lung:chest clear, no wheezing, rales, normal symmetric air entry Abdomin: soft, non-tender, without masses or organomegaly EXT:no erythema, induration, or nodules   Lab Results: Lab Results  Component Value Date   WBC 8.2 12/17/2013   HGB 8.0* 12/17/2013   HCT 25.7* 12/17/2013   MCV 79.6 12/17/2013   PLT 210 12/17/2013     Chemistry      Component Value Date/Time   NA 135* 12/17/2013 0444   K 3.8 12/17/2013 0444   CL 101 12/17/2013 0444   CO2 22 12/17/2013 0444   BUN 8 12/17/2013 0444   CREATININE 0.98 12/17/2013 0444      Component Value Date/Time   CALCIUM 8.2* 12/17/2013 0444   ALKPHOS 46 12/15/2013 0550   AST 10 12/15/2013 0550   ALT 7 12/15/2013 0550   BILITOT 0.4 12/15/2013 0550       Radiological Studies:  FINDINGS:  Lung bases clear.  Multiple hepatic cysts largest 18 x 17 mm.  Tiny calcified granulomata within spleen.  Liver, spleen, pancreas, kidneys and adrenal glands otherwise normal  appearance.  No hydronephrosis, ureteral calcification or ureteral dilatation.  Abnormal appearance of the urinary bladder with an enhancing  irregular soft tissue mass at the LEFT lateral aspect measuring 5.3  x 4.0 x 5.6 cm in size.  This most likely represents a bladder neoplasm.  Infiltration of adjacent fat planes in prevesical space is  concerning for direct tumor extension.  Tumor extends to near the LEFT ureterovesical junction without  obstruction/ureteral dilatation.  Dependent intermediate attenuation within the bladder could  represent a second tumor nodule or dependent blood clot 2.6 x 1.8 x  1.7 cm.  No definite pelvic adenopathy or perivesical tumor nodularity.  Prostate gland and seminal vesicles grossly unremarkable.   Mild hyperemia in LEFT perivesical soft tissues.  Normal appendix.  Scattered colonic diverticulosis greatest at sigmoid colon.  Stomach and bowel loops otherwise normal appearance.  No additional mass, adenopathy, free fluid, or free air.  Small umbilical hernia containing fat.  No definite osseous metastatic lesions.  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  IMPRESSION:  5.3 x 4.0 x 5.6 cm diameter irregular enhancing mass at the the LEFT  lateral aspect of the urinary bladder compatible with bladder  neoplasm with probable direct extension of tumor into the prevesical  space.  Small amount of blood clot or less likely an additional tumor nodule  dependently in bladder.  Bladder tumor extends to near the LEFT ureterovesical junction  without causing LEFT hydronephrosis or ureteral dilatation.  No definite adenopathy or metastatic lesions.  Sigmoid diverticulosis  without diverticulitis.  Hepatic cystic disease.  Umbilical hernia.   Impression and Plan:  58 year old gentleman with the following issues:  1. 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 He has a 5.3 x 4.0 x 5.6 cm diameter mass at the left aspect of the urinary bladder. This was initially noted back in December of 2014 the patient declined intervention. He was hospitalized in September 2015 for anemia and clot retention. He underwent a repeat TURBT and clot evacuation. This is associated and doing relatively well. I discussed with him again the role of neoadjuvant chemotherapy utilizing cisplatin and Gemzar in an attempt to reduce his tumor size and improve his outcome and potentially prepare him for cystectomy. He is open to the idea and I will in to proceed. Risks and benefits of systemic chemotherapy and specifically this regimen was discussed. Complications include nausea, vomiting,  myelosuppression, neutropenia, neutropenic sepsis, renal toxicity, ototoxicity, infusion related reactions and possible need for gross factors support. His kidney function at baseline has excellent and he should be an excellent candidate for this regimen. We will set him up for a chemotherapy education class before the start of chemotherapy.  2. Iron deficiency anemia: This is related to chronic blood loss is from his bladder tumor. His iron levels in September 2015 are very low. I discussed with him the risks and benefits of oral iron versus IV iron. Infusion related complications, arthralgias and myalgias among others were discussed today as a potential toxicity of feraheme. Given the constipation he is having, he elected to proceed with IV iron. We will set that up for him in the near future.  3. IV access: The risks and benefits of a Port-A-Cath insertion was discussed. Complications include bleeding, thrombosis and infection we discussed and he is agreeable to proceed. Prescription for a more cream was given to the patient today.  4. Anti-emetics: Prescription for Zofran was given to the patient today.  5. Followup: Will be for the start of chemotherapy on 01/14/2014.    [facility], MD 10/13/201511:21 AM

## 2013-12-29 NOTE — Progress Notes (Signed)
Checked in new patient with on financial issues prior to seeing the dr. He has appt card.

## 2013-12-30 ENCOUNTER — Encounter: Payer: Self-pay | Admitting: Oncology

## 2013-12-30 NOTE — Progress Notes (Signed)
Put fmla form on nurse's desk °

## 2013-12-31 ENCOUNTER — Encounter: Payer: Self-pay | Admitting: Oncology

## 2013-12-31 NOTE — Progress Notes (Signed)
Faxed fmla form to [facility] @ [phone]

## 2014-01-01 ENCOUNTER — Other Ambulatory Visit: Payer: BC Managed Care – PPO

## 2014-01-01 ENCOUNTER — Ambulatory Visit (HOSPITAL_BASED_OUTPATIENT_CLINIC_OR_DEPARTMENT_OTHER): Payer: BC Managed Care – PPO

## 2014-01-01 ENCOUNTER — Other Ambulatory Visit (HOSPITAL_BASED_OUTPATIENT_CLINIC_OR_DEPARTMENT_OTHER): Payer: BC Managed Care – PPO

## 2014-01-01 VITALS — BP 127/75 | HR 89 | Temp 98.2°F

## 2014-01-01 DIAGNOSIS — C679 Malignant neoplasm of bladder, unspecified: Secondary | ICD-10-CM

## 2014-01-01 DIAGNOSIS — D500 Iron deficiency anemia secondary to blood loss (chronic): Secondary | ICD-10-CM

## 2014-01-01 DIAGNOSIS — Z23 Encounter for immunization: Secondary | ICD-10-CM

## 2014-01-01 LAB — CBC WITH DIFFERENTIAL/PLATELET
BASO%: 0.8 % (ref 0.0–2.0)
BASOS ABS: 0.1 10*3/uL (ref 0.0–0.1)
EOS%: 1.3 % (ref 0.0–7.0)
Eosinophils Absolute: 0.1 10*3/uL (ref 0.0–0.5)
HEMATOCRIT: 31.4 % — AB (ref 38.4–49.9)
HEMOGLOBIN: 9.4 g/dL — AB (ref 13.0–17.1)
LYMPH%: 17.8 % (ref 14.0–49.0)
MCH: 23.4 pg — AB (ref 27.2–33.4)
MCHC: 29.9 g/dL — ABNORMAL LOW (ref 32.0–36.0)
MCV: 78.3 fL — AB (ref 79.3–98.0)
MONO#: 0.6 10*3/uL (ref 0.1–0.9)
MONO%: 8 % (ref 0.0–14.0)
NEUT#: 5.7 10*3/uL (ref 1.5–6.5)
NEUT%: 72.1 % (ref 39.0–75.0)
PLATELETS: 409 10*3/uL — AB (ref 140–400)
RBC: 4.01 10*6/uL — ABNORMAL LOW (ref 4.20–5.82)
RDW: 19.1 % — ABNORMAL HIGH (ref 11.0–14.6)
WBC: 7.9 10*3/uL (ref 4.0–10.3)
lymph#: 1.4 10*3/uL (ref 0.9–3.3)

## 2014-01-01 LAB — IRON AND TIBC CHCC
%SAT: 4 % — ABNORMAL LOW (ref 20–55)
Iron: 18 ug/dL — ABNORMAL LOW (ref 42–163)
TIBC: 484 ug/dL — ABNORMAL HIGH (ref 202–409)
UIBC: 466 ug/dL — ABNORMAL HIGH (ref 117–376)

## 2014-01-01 LAB — FERRITIN CHCC: Ferritin: 11 ng/ml — ABNORMAL LOW (ref 22–316)

## 2014-01-01 MED ORDER — SODIUM CHLORIDE 0.9 % IV SOLN
1020.0000 mg | Freq: Once | INTRAVENOUS | Status: AC
  Administered 2014-01-01: 1020 mg via INTRAVENOUS
  Filled 2014-01-01: qty 34

## 2014-01-01 MED ORDER — INFLUENZA VAC SPLIT QUAD 0.5 ML IM SUSY
0.5000 mL | PREFILLED_SYRINGE | Freq: Once | INTRAMUSCULAR | Status: AC
  Administered 2014-01-01: 0.5 mL via INTRAMUSCULAR
  Filled 2014-01-01: qty 0.5

## 2014-01-01 MED ORDER — SODIUM CHLORIDE 0.9 % IV SOLN
Freq: Once | INTRAVENOUS | Status: AC
  Administered 2014-01-01: 11:00:00 via INTRAVENOUS

## 2014-01-01 NOTE — Patient Instructions (Signed)

## 2014-01-02 ENCOUNTER — Other Ambulatory Visit: Payer: Self-pay | Admitting: Radiology

## 2014-01-04 ENCOUNTER — Other Ambulatory Visit: Payer: Self-pay | Admitting: Radiology

## 2014-01-05 ENCOUNTER — Ambulatory Visit (HOSPITAL_COMMUNITY)
Admission: RE | Admit: 2014-01-05 | Discharge: 2014-01-05 | Disposition: A | Discharge status: Home / Self Care | Payer: BC Managed Care – PPO | Source: Ambulatory Visit | Attending: Oncology | Admitting: Oncology

## 2014-01-05 ENCOUNTER — Encounter (HOSPITAL_COMMUNITY): Payer: Self-pay

## 2014-01-05 ENCOUNTER — Other Ambulatory Visit: Payer: Self-pay | Admitting: Oncology

## 2014-01-05 DIAGNOSIS — C679 Malignant neoplasm of bladder, unspecified: Secondary | ICD-10-CM | POA: Diagnosis not present

## 2014-01-05 DIAGNOSIS — Z79899 Other long term (current) drug therapy: Secondary | ICD-10-CM | POA: Insufficient documentation

## 2014-01-05 DIAGNOSIS — Z452 Encounter for adjustment and management of vascular access device: Secondary | ICD-10-CM | POA: Insufficient documentation

## 2014-01-05 DIAGNOSIS — Z87891 Personal history of nicotine dependence: Secondary | ICD-10-CM | POA: Insufficient documentation

## 2014-01-05 DIAGNOSIS — D500 Iron deficiency anemia secondary to blood loss (chronic): Secondary | ICD-10-CM

## 2014-01-05 DIAGNOSIS — Z79891 Long term (current) use of opiate analgesic: Secondary | ICD-10-CM | POA: Diagnosis not present

## 2014-01-05 LAB — BASIC METABOLIC PANEL
Anion gap: 13 (ref 5–15)
BUN: 11 mg/dL (ref 6–23)
CALCIUM: 9.2 mg/dL (ref 8.4–10.5)
CO2: 24 mEq/L (ref 19–32)
CREATININE: 0.84 mg/dL (ref 0.50–1.35)
Chloride: 99 mEq/L (ref 96–112)
GFR calc Af Amer: >90 mL/min (ref >90)
GLUCOSE: 95 mg/dL (ref 70–99)
Potassium: 4.2 mEq/L (ref 3.7–5.3)
SODIUM: 136 meq/L — AB (ref 137–147)

## 2014-01-05 LAB — APTT: aPTT: 30 seconds (ref 24–37)

## 2014-01-05 LAB — CBC
HCT: 31.7 % — ABNORMAL LOW (ref 39.0–52.0)
Hemoglobin: 9.8 g/dL — ABNORMAL LOW (ref 13.0–17.0)
MCH: 24 pg — ABNORMAL LOW (ref 26.0–34.0)
MCHC: 30.9 g/dL (ref 30.0–36.0)
MCV: 77.7 fL — ABNORMAL LOW (ref 78.0–100.0)
PLATELETS: 363 10*3/uL (ref 150–400)
RBC: 4.08 MIL/uL — ABNORMAL LOW (ref 4.22–5.81)
RDW: 20.3 % — AB (ref 11.5–15.5)
WBC: 8.1 10*3/uL (ref 4.0–10.5)

## 2014-01-05 LAB — PROTIME-INR
INR: 1.08 (ref 0.00–1.49)
Prothrombin Time: 14.1 seconds (ref 11.6–15.2)

## 2014-01-05 MED ORDER — HEPARIN SOD (PORK) LOCK FLUSH 100 UNIT/ML IV SOLN
INTRAVENOUS | Status: AC
  Filled 2014-01-05: qty 5

## 2014-01-05 MED ORDER — SODIUM CHLORIDE 0.9 % IV SOLN
Freq: Once | INTRAVENOUS | Status: AC
  Administered 2014-01-05: 13:00:00 via INTRAVENOUS

## 2014-01-05 MED ORDER — MIDAZOLAM HCL 2 MG/2ML IJ SOLN
INTRAMUSCULAR | Status: AC
Start: 2014-01-05 — End: 2014-01-05
  Filled 2014-01-05: qty 6

## 2014-01-05 MED ORDER — CEFAZOLIN SODIUM-DEXTROSE 2-3 GM-% IV SOLR
2.0000 g | Freq: Once | INTRAVENOUS | Status: AC
  Administered 2014-01-05: 2 g via INTRAVENOUS

## 2014-01-05 MED ORDER — MIDAZOLAM HCL 2 MG/2ML IJ SOLN
INTRAMUSCULAR | Status: AC | PRN
  Administered 2014-01-05 (×3): 1 mg via INTRAVENOUS

## 2014-01-05 MED ORDER — FENTANYL CITRATE 0.05 MG/ML IJ SOLN
INTRAMUSCULAR | Status: AC | PRN
  Administered 2014-01-05 (×2): 25 ug via INTRAVENOUS
  Administered 2014-01-05: 50 ug via INTRAVENOUS

## 2014-01-05 MED ORDER — CEFAZOLIN SODIUM-DEXTROSE 2-3 GM-% IV SOLR
INTRAVENOUS | Status: AC
  Administered 2014-01-05: 2 g via INTRAVENOUS
  Filled 2014-01-05: qty 50

## 2014-01-05 MED ORDER — FENTANYL CITRATE 0.05 MG/ML IJ SOLN
INTRAMUSCULAR | Status: AC
  Filled 2014-01-05: qty 6

## 2014-01-05 MED ORDER — HEPARIN SOD (PORK) LOCK FLUSH 100 UNIT/ML IV SOLN
INTRAVENOUS | Status: AC | PRN
  Administered 2014-01-05: 500 [IU]

## 2014-01-05 MED ORDER — LIDOCAINE HCL 1 % IJ SOLN
INTRAMUSCULAR | Status: AC
  Filled 2014-01-05: qty 20

## 2014-01-05 NOTE — Discharge Instructions (Signed)
Leave dressing on for 24 hours.  You may shower after 24 hours.  Please remove the dressing before you shower.     Implanted Port Insertion, Care After Refer to this sheet in the next few weeks. These instructions provide you with information on caring for yourself after your procedure. Your health care provider may also give you more specific instructions. Your treatment has been planned according to current medical practices, but problems sometimes occur. Call your health care provider if you have any problems or questions after your procedure. WHAT TO EXPECT AFTER THE PROCEDURE After your procedure, it is typical to have the following:   Discomfort at the port insertion site. Ice packs to the area will help.  Bruising on the skin over the port. This will subside in 3-4 days. HOME CARE INSTRUCTIONS  After your port is placed, you will get a manufacturer's information card. The card has information about your port. Keep this card with you at all times.   Know what kind of port you have. There are many types of ports available.   Wear a medical alert bracelet in case of an emergency. This can help alert health care workers that you have a port.   The port can stay in for as long as your health care provider believes it is necessary.   A home health care nurse may give medicines and take care of the port.   You or a family member can get special training and directions for giving medicine and taking care of the port at home.  SEEK MEDICAL CARE IF:   Your port does not flush or you are unable to get a blood return.   You have a fever or chills. SEEK IMMEDIATE MEDICAL CARE IF:  You have new fluid or pus coming from your incision.   You notice a bad smell coming from your incision site.   You have swelling, pain, or more redness at the incision or port site.   You have chest pain or shortness of breath. Document Released: 12/24/2012 Document Revised: 03/10/2013 Document  Reviewed: 12/24/2012 [facility] Patient Information 2015 [place], [place]. This information is not intended to replace advice given to you by your health care provider. Make sure you discuss any questions you have with your health care provider. Conscious Sedation, Adult, Care After Refer to this sheet in the next few weeks. These instructions provide you with information on caring for yourself after your procedure. Your health care provider may also give you more specific instructions. Your treatment has been planned according to current medical practices, but problems sometimes occur. Call your health care provider if you have any problems or questions after your procedure. WHAT TO EXPECT AFTER THE PROCEDURE  After your procedure:  You may feel sleepy, clumsy, and have poor balance for several hours.  Vomiting may occur if you eat too soon after the procedure. HOME CARE INSTRUCTIONS  Do not participate in any activities where you could become injured for at least 24 hours. Do not:  Drive.  Swim.  Ride a bicycle.  Operate heavy machinery.  Cook.  Use power tools.  Climb ladders.  Work from a high place.  Do not make important decisions or sign legal documents until you are improved.  If you vomit, drink water, juice, or soup when you can drink without vomiting. Make sure you have little or no nausea before eating solid foods.  Only take over-the-counter or prescription medicines for pain, discomfort, or fever as directed  by your health care provider.  Make sure you and your family fully understand everything about the medicines given to you, including what side effects may occur.  You should not drink alcohol, take sleeping pills, or take medicines that cause drowsiness for at least 24 hours.  If you smoke, do not smoke without supervision.  If you are feeling better, you may resume normal activities 24 hours after you were sedated.  Keep all appointments with your health  care provider. SEEK MEDICAL CARE IF:  Your skin is pale or bluish in color.  You continue to feel nauseous or vomit.  Your pain is getting worse and is not helped by medicine.  You have bleeding or swelling.  You are still sleepy or feeling clumsy after 24 hours. SEEK IMMEDIATE MEDICAL CARE IF:  You develop a rash.  You have difficulty breathing.  You develop any type of allergic problem.  You have a fever. MAKE SURE YOU:  Understand these instructions.  Will watch your condition.  Will get help right away if you are not doing well or get worse. Document Released: 12/24/2012 Document Reviewed: 12/24/2012 [facility] Patient Information 2015 [place], [place]. This information is not intended to replace advice given to you by your health care provider. Make sure you discuss any questions you have with your health care provider.

## 2014-01-05 NOTE — H&P (Signed)
Chief Complaint: "I'm getting a port a cath"  Referring Physician(s): Shadad,Firas N  History of Present Illness: Erik Gross is a 58 y.o. male with history of muscle invasive TCC bladder who presents today for port a cath placement for chemotherapy.  Past Medical History  Diagnosis Date  . Bladder tumor   . Nocturia   . Arthritis   . Wears glasses   . Cancer     Past Surgical History  Procedure Laterality Date  . Lumbar disc surgery  01-17-2013  . Inguinal hernia repair Right 1994  . Transurethral resection of bladder tumor N/A 02/25/2013    Procedure: TRANSURETHRAL RESECTION OF BLADDER TUMOR (TURBT) WITH POST OP MITOMYCIN-C;  Surgeon: Ardis Hughs, MD;  Location: [facility];  Service: Urology;  Laterality: N/A;  . Cystoscopy w/ retrogrades Bilateral 02/25/2013    Procedure: BILATERAL RETROGRADE PYELOGRAM;  Surgeon: Ardis Hughs, MD;  Location: [facility];  Service: Urology;  Laterality: Bilateral;  . Transurethral resection of bladder tumor with gyrus (turbt-gyrus) N/A 12/16/2013    Procedure: TRANSURETHRAL RESECTION OF BLADDER TUMOR AND CLOT EVACUATION;  Surgeon: Ardis Hughs, MD;  Location: [facility];  Service: Urology;  Laterality: N/A;    Allergies: Review of patient's allergies indicates no known allergies.  Medications: Prior to Admission medications   Medication Sig Start Date End Date Taking? Authorizing Provider  acetaminophen (TYLENOL) 500 MG tablet Take by mouth.   Yes Historical Provider, MD  docusate sodium (COLACE) 100 MG capsule Take 1 capsule (100 mg total) by mouth 2 (two) times daily as needed (take to keep stool soft.). 12/17/13  Yes Ardis Hughs, MD  bisacodyl (STIMULANT LAXATIVE) 5 MG EC tablet Take by mouth.    Historical Provider, MD  calcium carbonate (TUMS - DOSED IN MG ELEMENTAL CALCIUM) 500 MG chewable tablet Chew 1 tablet by mouth daily.    Historical Provider, MD  ciprofloxacin (CIPRO)  500 MG tablet  12/28/13   Historical Provider, MD  ferrous sulfate 325 (65 FE) MG tablet Take by mouth.    Historical Provider, MD  fesoterodine (TOVIAZ) 4 MG TB24 tablet Take 1 tablet (4 mg total) by mouth daily. 12/17/13   Ardis Hughs, MD  lidocaine-prilocaine (EMLA) cream Apply 1 application topically as needed. Apply to port on the day of chemotherapy. 12/29/13   Wyatt Portela, MD  Multiple Vitamin (MULTIVITAMIN) tablet Take 1 tablet by mouth daily.    Historical Provider, MD  ondansetron (ZOFRAN) 8 MG tablet Take 1 tablet (8 mg total) by mouth every 8 (eight) hours as needed for nausea or vomiting. 12/29/13   Wyatt Portela, MD  opium-belladonna (B&O SUPPRETTES) 16.2-60 MG suppository Place 1 suppository rectally every 6 (six) hours as needed for bladder spasms. 12/17/13   Theodis Blaze, MD  oxyCODONE (ROXICODONE) 5 MG immediate release tablet Take 1 tablet (5 mg total) by mouth every 4 (four) hours as needed. 12/17/13   Ardis Hughs, MD  TURMERIC PO Take 1 tablet by mouth daily.    Historical Provider, MD  Wheat Dextrin (BENEFIBER DRINK MIX PO) Take by mouth.    Historical Provider, MD  zolpidem (AMBIEN) 5 MG tablet Take 1 tablet (5 mg total) by mouth at bedtime as needed for sleep. 12/17/13   Theodis Blaze, MD    No family history on file.  History   Social History  . Marital Status: Married    Spouse Name: N/A  Number of Children: N/A  . Years of Education: N/A   Social History Main Topics  . Smoking status: Former Smoker -- 2.00 packs/day for 30 years    Types: Cigarettes    Quit date: 02/24/2004  . Smokeless tobacco: Never Used  . Alcohol Use: No  . Drug Use: No  . Sexual Activity: Not on file   Other Topics Concern  . Not on file   Social History Narrative  . No narrative on file         Review of Systems  Constitutional: Negative for fever and chills.       Mild fatigue  Respiratory: Negative for cough and shortness of breath.   Cardiovascular:  Negative for chest pain.  Gastrointestinal: Negative for nausea, vomiting, abdominal pain and blood in stool.       Occ constipation  Genitourinary: Negative for dysuria and hematuria.  Musculoskeletal: Negative for back pain.  Neurological: Negative for headaches.  Hematological: Does not bruise/bleed easily.    Vital Signs: BP 120/73  Pulse 87  Temp(Src) 98.7 F (37.1 C) (Oral)  Resp 18  SpO2 99%  Physical Exam  Constitutional: He is oriented to person, place, and time. He appears well-developed and well-nourished.  Cardiovascular: Normal rate and regular rhythm.   Pulmonary/Chest: Effort normal and breath sounds normal.  Abdominal: Soft. Bowel sounds are normal. There is no tenderness.  Musculoskeletal: Normal range of motion.  Trace LE edema  Neurological: He is alert and oriented to person, place, and time.    Imaging: Ct Abdomen Pelvis W Contrast  12/14/2013   CLINICAL DATA:  Increasing hematuria over past couple weeks, shortness of breath, weakness, now with hemoglobin of 5 g, history of bladder cancer 1 year ago  EXAM: CT ABDOMEN AND PELVIS WITH CONTRAST  TECHNIQUE: Multidetector CT imaging of the abdomen and pelvis was performed using the standard protocol following bolus administration of intravenous contrast. Sagittal and coronal MPR images reconstructed from axial data set.  CONTRAST:  24mL OMNIPAQUE IOHEXOL 300 MG/ML SOLN IV. Dilute oral contrast.  COMPARISON:  None.  FINDINGS: Lung bases clear.  Multiple hepatic cysts largest 18 x 17 mm.  Tiny calcified granulomata within spleen.  Liver, spleen, pancreas, kidneys and adrenal glands otherwise normal appearance.  No hydronephrosis, ureteral calcification or ureteral dilatation.  Abnormal appearance of the urinary bladder with an enhancing irregular soft tissue mass at the LEFT lateral aspect measuring 5.3 x 4.0 x 5.6 cm in size.  This most likely represents a bladder neoplasm.  Infiltration of adjacent fat planes in  prevesical space is concerning for direct tumor extension.  Tumor extends to near the LEFT ureterovesical junction without obstruction/ureteral dilatation.  Dependent intermediate attenuation within the bladder could represent a second tumor nodule or dependent blood clot 2.6 x 1.8 x 1.7 cm.  No definite pelvic adenopathy or perivesical tumor nodularity.  Prostate gland and seminal vesicles grossly unremarkable.  Mild hyperemia in LEFT perivesical soft tissues.  Normal appendix.  Scattered colonic diverticulosis greatest at sigmoid colon.  Stomach and bowel loops otherwise normal appearance.  No additional mass, adenopathy, free fluid, or free air.  Small umbilical hernia containing fat.  No definite osseous metastatic lesions.  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  IMPRESSION: 5.3 x 4.0 x 5.6 cm diameter irregular enhancing mass at the the LEFT lateral aspect of the urinary bladder compatible with bladder neoplasm with probable direct extension of tumor into the prevesical space.  Small amount of blood clot or less likely an additional tumor nodule dependently in bladder.  Bladder tumor extends to near the LEFT ureterovesical junction without causing LEFT hydronephrosis or ureteral dilatation.  No definite adenopathy or metastatic lesions.  Sigmoid diverticulosis without diverticulitis.  Hepatic cystic disease.  Umbilical hernia.  Findings called to Dr. Sabra Heck on 12/14/2013 at 1553 hours.   Electronically Signed   By: Lavonia Dana M.D.   On: 12/14/2013 15:54    Labs:  CBC:  Recent Labs  12/15/13 0550 12/15/13 1800 12/16/13 0605 12/17/13 0444 01/01/14 0848  WBC 5.9  --  7.5 8.2 7.9  HGB 6.6* 8.4* 8.6* 8.0* 9.4*  HCT 21.2* 26.6* 26.5* 25.7* 31.4*  PLT 224  --  231 210 409*    COAGS:  Recent Labs  12/15/13 0550  INR 1.11    BMP:  Recent Labs  12/14/13 1408 12/15/13 0550 12/16/13 0605 12/17/13 0444  NA 136* 138  136* 135*  K 3.7 3.9 3.9 3.8  CL 100 105 102 101  CO2 23 21 21 22   GLUCOSE 99 103* 113* 119*  BUN 12 9 10 8   CALCIUM 8.5 8.1* 8.3* 8.2*  CREATININE 1.11 0.95 0.95 0.98  GFRNONAA 70* 89* 89* 88*  GFRAA 82* >90 >90 >90    LIVER FUNCTION TESTS:  Recent Labs  12/14/13 1408 12/15/13 0550  BILITOT 0.2* 0.4  AST 12 10  ALT 8 7  ALKPHOS 53 46  PROT 6.7 5.9*  ALBUMIN 3.4* 2.9*  01/05/14 labs pending  TUMOR MARKERS: No results found for this basename: AFPTM, CEA, CA199, CHROMGRNA,  in the last 8760 hours  Assessment and Plan: Wilbon D Bonny is a 58 y.o. male with history of muscle invasive TCC bladder who presents today for port a cath placement for chemotherapy. Details/risks of procedure d/w pt/wife with their understanding and consent.            Signed: Autumn Messing 01/05/2014, 12:59 PM

## 2014-01-05 NOTE — Procedures (Signed)
R IJ Port cathter placement with US and fluoroscopy No complication No blood loss. See complete dictation in [facility] PACS.  

## 2014-01-05 NOTE — H&P (Signed)
This procedure has been fully reviewed with the patient and written informed consent has been obtained.

## 2014-01-11 ENCOUNTER — Telehealth: Payer: Self-pay | Admitting: Medical Oncology

## 2014-01-11 NOTE — Telephone Encounter (Signed)
LVMOM with spouse that Dr. Alen Blew would like for patient to come in to give a urine sample for a UA, C & S, prior to prescription, patient can come in today to provided sample. Spouse to return call.

## 2014-01-11 NOTE — Telephone Encounter (Signed)
Spouse called to report and request a prescription for patient. States patient is having "burning during urination getting worse and he is reducing his fluid intake because of it." Reports urine darker in color and denies hematuria or any other symptoms. States that during [place] with MD was informed of a prescription that could help him with this symptom. Informed spouse will review with MD and return call to her.  MD in boxed for review.  LOV 12/29/2013 F/U lab/tx 10/29 MD 11/05

## 2014-01-11 NOTE — Telephone Encounter (Signed)
Return call from spouse, states that [facility] had completed a UA r/t the burning and it resulted, per spouse, as "no infection." Wife states pt was given prescription for Cipro on Oct 12 to start, patient was told to complete Cipro, which he did Oct 18. Suggested to spouse to f/u with [facility] and inform them that pt is still experiencing burning and has increased with urination and return call to our office should she have further questions or concerns. Spouse expressed thanks, knows to call us.

## 2014-01-11 NOTE — Telephone Encounter (Signed)
He needs to give a urine sample for UA, C and S before any prescription to be given.

## 2014-01-13 ENCOUNTER — Other Ambulatory Visit: Payer: Self-pay | Admitting: Medical Oncology

## 2014-01-13 MED ORDER — PROCHLORPERAZINE MALEATE 10 MG PO TABS: 10.0000 mg | ORAL_TABLET | Freq: Four times a day (QID) | ORAL | Status: DC | PRN

## 2014-01-13 NOTE — Telephone Encounter (Signed)
Informed spouse of compazine prescription e-scribed to patient's pharmacy.

## 2014-01-14 ENCOUNTER — Encounter: Payer: Self-pay | Admitting: Nurse Practitioner

## 2014-01-14 ENCOUNTER — Ambulatory Visit (HOSPITAL_BASED_OUTPATIENT_CLINIC_OR_DEPARTMENT_OTHER): Payer: BC Managed Care – PPO

## 2014-01-14 ENCOUNTER — Other Ambulatory Visit (HOSPITAL_BASED_OUTPATIENT_CLINIC_OR_DEPARTMENT_OTHER): Payer: BC Managed Care – PPO

## 2014-01-14 ENCOUNTER — Ambulatory Visit (HOSPITAL_BASED_OUTPATIENT_CLINIC_OR_DEPARTMENT_OTHER): Payer: BC Managed Care – PPO | Admitting: Nurse Practitioner

## 2014-01-14 VITALS — BP 108/64 | HR 91 | Temp 98.8°F | Resp 18

## 2014-01-14 DIAGNOSIS — C679 Malignant neoplasm of bladder, unspecified: Secondary | ICD-10-CM

## 2014-01-14 DIAGNOSIS — D500 Iron deficiency anemia secondary to blood loss (chronic): Secondary | ICD-10-CM

## 2014-01-14 DIAGNOSIS — Z5111 Encounter for antineoplastic chemotherapy: Secondary | ICD-10-CM

## 2014-01-14 DIAGNOSIS — T7840XA Allergy, unspecified, initial encounter: Secondary | ICD-10-CM | POA: Insufficient documentation

## 2014-01-14 LAB — COMPREHENSIVE METABOLIC PANEL (CC13)
ALK PHOS: 66 U/L (ref 40–150)
ALT: 9 U/L (ref 0–55)
AST: 11 U/L (ref 5–34)
Albumin: 3.3 g/dL — ABNORMAL LOW (ref 3.5–5.0)
Anion Gap: 8 mEq/L (ref 3–11)
BILIRUBIN TOTAL: 0.51 mg/dL (ref 0.20–1.20)
BUN: 10.2 mg/dL (ref 7.0–26.0)
CO2: 25 mEq/L (ref 22–29)
CREATININE: 0.9 mg/dL (ref 0.7–1.3)
Calcium: 9.6 mg/dL (ref 8.4–10.4)
Chloride: 102 mEq/L (ref 98–109)
Glucose: 114 mg/dl (ref 70–140)
Potassium: 4.6 mEq/L (ref 3.5–5.1)
SODIUM: 135 meq/L — AB (ref 136–145)
TOTAL PROTEIN: 7 g/dL (ref 6.4–8.3)

## 2014-01-14 LAB — CBC WITH DIFFERENTIAL/PLATELET
BASO%: 0.2 % (ref 0.0–2.0)
Basophils Absolute: 0 10*3/uL (ref 0.0–0.1)
EOS%: 0.5 % (ref 0.0–7.0)
Eosinophils Absolute: 0.1 10*3/uL (ref 0.0–0.5)
HCT: 35.6 % — ABNORMAL LOW (ref 38.4–49.9)
HGB: 11 g/dL — ABNORMAL LOW (ref 13.0–17.1)
LYMPH%: 11.2 % — AB (ref 14.0–49.0)
MCH: 24.8 pg — AB (ref 27.2–33.4)
MCHC: 30.9 g/dL — ABNORMAL LOW (ref 32.0–36.0)
MCV: 80.2 fL (ref 79.3–98.0)
MONO#: 0.8 10*3/uL (ref 0.1–0.9)
MONO%: 7.8 % (ref 0.0–14.0)
NEUT#: 7.9 10*3/uL — ABNORMAL HIGH (ref 1.5–6.5)
NEUT%: 80.3 % — ABNORMAL HIGH (ref 39.0–75.0)
Platelets: 263 10*3/uL (ref 140–400)
RBC: 4.44 10*6/uL (ref 4.20–5.82)
RDW: 22.8 % — ABNORMAL HIGH (ref 11.0–14.6)
WBC: 9.8 10*3/uL (ref 4.0–10.3)
lymph#: 1.1 10*3/uL (ref 0.9–3.3)

## 2014-01-14 MED ORDER — POTASSIUM CHLORIDE 2 MEQ/ML IV SOLN
Freq: Once | INTRAVENOUS | Status: AC
  Administered 2014-01-14: 09:00:00 via INTRAVENOUS
  Filled 2014-01-14: qty 10

## 2014-01-14 MED ORDER — METHYLPREDNISOLONE SODIUM SUCC 125 MG IJ SOLR
125.0000 mg | Freq: Once | INTRAMUSCULAR | Status: AC
  Administered 2014-01-14: 125 mg via INTRAVENOUS

## 2014-01-14 MED ORDER — DIPHENHYDRAMINE HCL 50 MG/ML IJ SOLN
25.0000 mg | Freq: Once | INTRAMUSCULAR | Status: AC
Start: 2014-01-14 — End: 2014-01-14
  Administered 2014-01-14: 25 mg via INTRAVENOUS

## 2014-01-14 MED ORDER — SODIUM CHLORIDE 0.9 % IJ SOLN
10.0000 mL | INTRAMUSCULAR | Status: DC | PRN
Start: 2014-01-14 — End: 2014-01-14
  Administered 2014-01-14: 10 mL
  Filled 2014-01-14: qty 10

## 2014-01-14 MED ORDER — SODIUM CHLORIDE 0.9 % IV SOLN
Freq: Once | INTRAVENOUS | Status: AC
  Administered 2014-01-14: 09:00:00 via INTRAVENOUS

## 2014-01-14 MED ORDER — HEPARIN SOD (PORK) LOCK FLUSH 100 UNIT/ML IV SOLN
500.0000 [IU] | Freq: Once | INTRAVENOUS | Status: AC | PRN
  Administered 2014-01-14: 500 [IU]
  Filled 2014-01-14: qty 5

## 2014-01-14 MED ORDER — SODIUM CHLORIDE 0.9 % IV SOLN
75.0000 mg/m2 | Freq: Once | INTRAVENOUS | Status: AC
  Administered 2014-01-14: 153 mg via INTRAVENOUS
  Filled 2014-01-14: qty 153

## 2014-01-14 MED ORDER — GEMCITABINE HCL CHEMO INJECTION 1 GM/26.3ML
1000.0000 mg/m2 | Freq: Once | INTRAVENOUS | Status: AC
  Administered 2014-01-14: 2052 mg via INTRAVENOUS
  Filled 2014-01-14: qty 53.97

## 2014-01-14 MED ORDER — ACETAMINOPHEN 500 MG PO TABS
1000.0000 mg | ORAL_TABLET | Freq: Once | ORAL | Status: AC
  Administered 2014-01-14: 1000 mg via ORAL

## 2014-01-14 MED ORDER — PALONOSETRON HCL INJECTION 0.25 MG/5ML
INTRAVENOUS | Status: AC
  Filled 2014-01-14: qty 5

## 2014-01-14 MED ORDER — PALONOSETRON HCL INJECTION 0.25 MG/5ML
0.2500 mg | Freq: Once | INTRAVENOUS | Status: AC
  Administered 2014-01-14: 0.25 mg via INTRAVENOUS

## 2014-01-14 MED ORDER — FAMOTIDINE IN NACL 20-0.9 MG/50ML-% IV SOLN
20.0000 mg | Freq: Once | INTRAVENOUS | Status: AC
  Administered 2014-01-14: 20 mg via INTRAVENOUS

## 2014-01-14 MED ORDER — DEXAMETHASONE SODIUM PHOSPHATE 20 MG/5ML IJ SOLN
INTRAMUSCULAR | Status: AC
  Filled 2014-01-14: qty 5

## 2014-01-14 MED ORDER — DEXAMETHASONE SODIUM PHOSPHATE 20 MG/5ML IJ SOLN
12.0000 mg | Freq: Once | INTRAMUSCULAR | Status: AC
  Administered 2014-01-14: 12 mg via INTRAVENOUS

## 2014-01-14 MED ORDER — SODIUM CHLORIDE 0.9 % IV SOLN
150.0000 mg | Freq: Once | INTRAVENOUS | Status: AC
  Administered 2014-01-14: 150 mg via INTRAVENOUS
  Filled 2014-01-14: qty 5

## 2014-01-14 NOTE — Patient Instructions (Addendum)
[facility] Discharge Instructions for Patients Receiving Chemotherapy  Today you received the following chemotherapy agents Gemzar and Cisplatin.  To help prevent nausea and vomiting after your treatment, we encourage you to take your nausea medication as prescribed.   If you develop nausea and vomiting that is not controlled by your nausea medication, call the clinic.   BELOW ARE SYMPTOMS THAT SHOULD BE REPORTED IMMEDIATELY:  *FEVER GREATER THAN 100.5 F  *CHILLS WITH OR WITHOUT FEVER  NAUSEA AND VOMITING THAT IS NOT CONTROLLED WITH YOUR NAUSEA MEDICATION  *UNUSUAL SHORTNESS OF BREATH  *UNUSUAL BRUISING OR BLEEDING  TENDERNESS IN MOUTH AND THROAT WITH OR WITHOUT PRESENCE OF ULCERS  *URINARY PROBLEMS  *BOWEL PROBLEMS  UNUSUAL RASH Items with * indicate a potential emergency and should be followed up as soon as possible.  Feel free to call the clinic you have any questions or concerns. The clinic phone number is [phone].   Gemcitabine injection What is this medicine? GEMCITABINE (jem SIT a been) is a chemotherapy drug. This medicine is used to treat many types of cancer like breast cancer, lung cancer, pancreatic cancer, and ovarian cancer. This medicine may be used for other purposes; ask your health care provider or pharmacist if you have questions. COMMON BRAND NAME(S): Gemzar What should I tell my health care provider before I take this medicine? They need to know if you have any of these conditions: -blood disorders -infection -kidney disease -liver disease -recent or ongoing radiation therapy -an unusual or allergic reaction to gemcitabine, other chemotherapy, other medicines, foods, dyes, or preservatives -pregnant or trying to get pregnant -breast-feeding How should I use this medicine? This drug is given as an infusion into a vein. It is administered in a hospital or clinic by a specially trained health care professional. Talk to your  pediatrician regarding the use of this medicine in children. Special care may be needed. Overdosage: If you think you have taken too much of this medicine contact a poison control center or emergency room at once. NOTE: This medicine is only for you. Do not share this medicine with others. What if I miss a dose? It is important not to miss your dose. Call your doctor or health care professional if you are unable to keep an appointment. What may interact with this medicine? -medicines to increase blood counts like filgrastim, pegfilgrastim, sargramostim -some other chemotherapy drugs like cisplatin -vaccines Talk to your doctor or health care professional before taking any of these medicines: -acetaminophen -aspirin -ibuprofen -ketoprofen -naproxen This list may not describe all possible interactions. Give your health care provider a list of all the medicines, herbs, non-prescription drugs, or dietary supplements you use. Also tell them if you smoke, drink alcohol, or use illegal drugs. Some items may interact with your medicine. What should I watch for while using this medicine? Visit your doctor for checks on your progress. This drug may make you feel generally unwell. This is not uncommon, as chemotherapy can affect healthy cells as well as cancer cells. Report any side effects. Continue your course of treatment even though you feel ill unless your doctor tells you to stop. In some cases, you may be given additional medicines to help with side effects. Follow all directions for their use. Call your doctor or health care professional for advice if you get a fever, chills or sore throat, or other symptoms of a cold or flu. Do not treat yourself. This drug decreases your body's ability to fight infections.  Try to avoid being around people who are sick. This medicine may increase your risk to bruise or bleed. Call your doctor or health care professional if you notice any unusual bleeding. Be  careful brushing and flossing your teeth or using a toothpick because you may get an infection or bleed more easily. If you have any dental work done, tell your dentist you are receiving this medicine. Avoid taking products that contain aspirin, acetaminophen, ibuprofen, naproxen, or ketoprofen unless instructed by your doctor. These medicines may hide a fever. Women should inform their doctor if they wish to become pregnant or think they might be pregnant. There is a potential for serious side effects to an unborn child. Talk to your health care professional or pharmacist for more information. Do not breast-feed an infant while taking this medicine. What side effects may I notice from receiving this medicine? Side effects that you should report to your doctor or health care professional as soon as possible: -allergic reactions like skin rash, itching or hives, swelling of the face, lips, or tongue -low blood counts - this medicine may decrease the number of white blood cells, red blood cells and platelets. You may be at increased risk for infections and bleeding. -signs of infection - fever or chills, cough, sore throat, pain or difficulty passing urine -signs of decreased platelets or bleeding - bruising, pinpoint red spots on the skin, black, tarry stools, blood in the urine -signs of decreased red blood cells - unusually weak or tired, fainting spells, lightheadedness -breathing problems -chest pain -mouth sores -nausea and vomiting -pain, swelling, redness at site where injected -pain, tingling, numbness in the hands or feet -stomach pain -swelling of ankles, feet, hands -unusual bleeding Side effects that usually do not require medical attention (report to your doctor or health care professional if they continue or are bothersome): -constipation -diarrhea -hair loss -loss of appetite -stomach upset This list may not describe all possible side effects. Call your doctor for medical  advice about side effects. You may report side effects to FDA at 1-800-FDA-1088. Where should I keep my medicine? This drug is given in a hospital or clinic and will not be stored at home. NOTE: This sheet is a summary. It may not cover all possible information. If you have questions about this medicine, talk to your doctor, pharmacist, or health care provider.  2015, Elsevier/Gold Standard. (2007-07-15 18:45:54)   Cisplatin injection What is this medicine? CISPLATIN (SIS pla tin) is a chemotherapy drug. It targets fast dividing cells, like cancer cells, and causes these cells to die. This medicine is used to treat many types of cancer like bladder, ovarian, and testicular cancers. This medicine may be used for other purposes; ask your health care provider or pharmacist if you have questions. COMMON BRAND NAME(S): Platinol, Platinol -AQ What should I tell my health care provider before I take this medicine? They need to know if you have any of these conditions: -blood disorders -hearing problems -kidney disease -recent or ongoing radiation therapy -an unusual or allergic reaction to cisplatin, carboplatin, other chemotherapy, other medicines, foods, dyes, or preservatives -pregnant or trying to get pregnant -breast-feeding How should I use this medicine? This drug is given as an infusion into a vein. It is administered in a hospital or clinic by a specially trained health care professional. Talk to your pediatrician regarding the use of this medicine in children. Special care may be needed. Overdosage: If you think you have taken too much of this medicine  contact a poison control center or emergency room at once. NOTE: This medicine is only for you. Do not share this medicine with others. What if I miss a dose? It is important not to miss a dose. Call your doctor or health care professional if you are unable to keep an appointment. What may interact with this  medicine? -dofetilide -foscarnet -medicines for seizures -medicines to increase blood counts like filgrastim, pegfilgrastim, sargramostim -probenecid -pyridoxine used with altretamine -rituximab -some antibiotics like amikacin, gentamicin, neomycin, polymyxin B, streptomycin, tobramycin -sulfinpyrazone -vaccines -zalcitabine Talk to your doctor or health care professional before taking any of these medicines: -acetaminophen -aspirin -ibuprofen -ketoprofen -naproxen This list may not describe all possible interactions. Give your health care provider a list of all the medicines, herbs, non-prescription drugs, or dietary supplements you use. Also tell them if you smoke, drink alcohol, or use illegal drugs. Some items may interact with your medicine. What should I watch for while using this medicine? Your condition will be monitored carefully while you are receiving this medicine. You will need important blood work done while you are taking this medicine. This drug may make you feel generally unwell. This is not uncommon, as chemotherapy can affect healthy cells as well as cancer cells. Report any side effects. Continue your course of treatment even though you feel ill unless your doctor tells you to stop. In some cases, you may be given additional medicines to help with side effects. Follow all directions for their use. Call your doctor or health care professional for advice if you get a fever, chills or sore throat, or other symptoms of a cold or flu. Do not treat yourself. This drug decreases your body's ability to fight infections. Try to avoid being around people who are sick. This medicine may increase your risk to bruise or bleed. Call your doctor or health care professional if you notice any unusual bleeding. Be careful brushing and flossing your teeth or using a toothpick because you may get an infection or bleed more easily. If you have any dental work done, tell your dentist you are  receiving this medicine. Avoid taking products that contain aspirin, acetaminophen, ibuprofen, naproxen, or ketoprofen unless instructed by your doctor. These medicines may hide a fever. Do not become pregnant while taking this medicine. Women should inform their doctor if they wish to become pregnant or think they might be pregnant. There is a potential for serious side effects to an unborn child. Talk to your health care professional or pharmacist for more information. Do not breast-feed an infant while taking this medicine. Drink fluids as directed while you are taking this medicine. This will help protect your kidneys. Call your doctor or health care professional if you get diarrhea. Do not treat yourself. What side effects may I notice from receiving this medicine? Side effects that you should report to your doctor or health care professional as soon as possible: -allergic reactions like skin rash, itching or hives, swelling of the face, lips, or tongue -signs of infection - fever or chills, cough, sore throat, pain or difficulty passing urine -signs of decreased platelets or bleeding - bruising, pinpoint red spots on the skin, black, tarry stools, nosebleeds -signs of decreased red blood cells - unusually weak or tired, fainting spells, lightheadedness -breathing problems -changes in hearing -gout pain -low blood counts - This drug may decrease the number of white blood cells, red blood cells and platelets. You may be at increased risk for infections and bleeding. -nausea  and vomiting -pain, swelling, redness or irritation at the injection site -pain, tingling, numbness in the hands or feet -problems with balance, movement -trouble passing urine or change in the amount of urine Side effects that usually do not require medical attention (report to your doctor or health care professional if they continue or are bothersome): -changes in vision -loss of appetite -metallic taste in the mouth  or changes in taste This list may not describe all possible side effects. Call your doctor for medical advice about side effects. You may report side effects to FDA at 1-800-FDA-1088. Where should I keep my medicine? This drug is given in a hospital or clinic and will not be stored at home. NOTE: This sheet is a summary. It may not cover all possible information. If you have questions about this medicine, talk to your doctor, pharmacist, or health care provider.  2015, Elsevier/Gold Standard. (2007-06-10 14:40:54)

## 2014-01-14 NOTE — Progress Notes (Signed)
SYMPTOM MANAGEMENT CLINIC   HPI: Erik Gross 58 y.o. male diagnosed with bladder cancer.  Here today to initiate neoadjuvant cisplatin/gemcitabine chemotherapy regimen.  Patient presented to the [facility] today to initiate his first cycle of neoadjuvant cisplatin/gemcitabine chemotherapy.  He experienced a hypersensitivity reaction to the Emend infusion; which consisted of facial flushing and shortness of breath.  Vital signs remained stable; with the exception of oxygen saturation which did down to 87% on room air.  The Emend infusion was held; and hypersensitivity reaction protocol initiated.  Patient received Solu-Medrol 125 mg IV, Benadryl 25 mg IV, and Pepcid 20 mg IV.  O2 was initially placed at 4 L via nasal cannula.  All hypersensitivity reaction symptoms did resolve; and vitals returned to baseline.  O2 was titrated off.   Hypersensitivity Reaction    CURRENT THERAPY: Upcoming Treatment Dates - LUNG Cisplatin D1 / Gemcitabine D1,8 q21d Days with orders from any treatment category:  01/21/2014      SCHEDULING COMMUNICATION      prochlorperazine (COMPAZINE) tablet 10 mg      Gemcitabine HCl (GEMZAR) 2,052 mg in sodium chloride 0.9 % 250 mL chemo infusion      sodium chloride 0.9 % injection 10 mL      heparin lock flush 100 unit/mL      heparin lock flush 100 unit/mL      alteplase (CATHFLO ACTIVASE) injection 2 mg      sodium chloride 0.9 % injection 3 mL      Cold Pack 1 packet      0.9 %  sodium chloride infusion      TREATMENT CONDITIONS 02/04/2014      SCHEDULING COMMUNICATION      palonosetron (ALOXI) injection 0.25 mg      Dexamethasone Sodium Phosphate (DECADRON) injection 12 mg      fosaprepitant (EMEND) 150 mg in sodium chloride 0.9 % 145 mL IVPB      Gemcitabine HCl (GEMZAR) 2,052 mg in sodium chloride 0.9 % 250 mL chemo infusion      CISplatin (PLATINOL) 153 mg in sodium chloride 0.9 % 500 mL chemo infusion      sodium chloride 0.9 % injection 10 mL    heparin lock flush 100 unit/mL      heparin lock flush 100 unit/mL      alteplase (CATHFLO ACTIVASE) injection 2 mg      sodium chloride 0.9 % injection 3 mL      Cold Pack 1 packet      0.9 %  sodium chloride infusion      dextrose 5 % and 0.45% NaCl 1,000 mL with potassium chloride 20 mEq, magnesium sulfate 12 mEq, mannitol 12.5 g infusion      TREATMENT CONDITIONS 02/11/2014      SCHEDULING COMMUNICATION      prochlorperazine (COMPAZINE) tablet 10 mg      Gemcitabine HCl (GEMZAR) 2,052 mg in sodium chloride 0.9 % 250 mL chemo infusion      sodium chloride 0.9 % injection 10 mL      heparin lock flush 100 unit/mL      heparin lock flush 100 unit/mL      alteplase (CATHFLO ACTIVASE) injection 2 mg      sodium chloride 0.9 % injection 3 mL      Cold Pack 1 packet      0.9 %  sodium chloride infusion      TREATMENT CONDITIONS    ROS  Past Medical  History  Diagnosis Date  . Bladder tumor   . Nocturia   . Arthritis   . Wears glasses   . Cancer     Past Surgical History  Procedure Laterality Date  . Lumbar disc surgery  01-17-2013  . Inguinal hernia repair Right 1994  . Transurethral resection of bladder tumor N/A 02/25/2013    Procedure: TRANSURETHRAL RESECTION OF BLADDER TUMOR (TURBT) WITH POST OP MITOMYCIN-C;  Surgeon: Ardis Hughs, MD;  Location: [facility];  Service: Urology;  Laterality: N/A;  . Cystoscopy w/ retrogrades Bilateral 02/25/2013    Procedure: BILATERAL RETROGRADE PYELOGRAM;  Surgeon: Ardis Hughs, MD;  Location: [facility];  Service: Urology;  Laterality: Bilateral;  . Transurethral resection of bladder tumor with gyrus (turbt-gyrus) N/A 12/16/2013    Procedure: TRANSURETHRAL RESECTION OF BLADDER TUMOR AND CLOT EVACUATION;  Surgeon: Ardis Hughs, MD;  Location: [facility];  Service: Urology;  Laterality: N/A;    has Anemia; Hematuria; Bladder cancer; and Hypersensitivity reaction on his problem list.     is  allergic to [facility].    Medication List       This list is accurate as of: 01/14/14  3:06 PM.  Always use your most recent med list.               acetaminophen 500 MG tablet  Commonly known as:  TYLENOL  Take 500 mg by mouth every 4 (four) hours as needed for mild pain or headache.     BENEFIBER DRINK MIX PO  Take 1 packet by mouth daily.     calcium carbonate 500 MG chewable tablet  Commonly known as:  TUMS - dosed in mg elemental calcium  Chew 1 tablet by mouth daily.     docusate sodium 100 MG capsule  Commonly known as:  COLACE  Take 1 capsule (100 mg total) by mouth 2 (two) times daily as needed (take to keep stool soft.).     Influenza Vac Split Quad 0.25 ML injection  Commonly known as:  FLUZONE  Inject 0.25 mLs into the muscle once.     lidocaine-prilocaine cream  Commonly known as:  EMLA  Apply 1 application topically as needed. Apply to port on the day of chemotherapy.     multivitamin tablet  Take 1 tablet by mouth daily.     ondansetron 8 MG tablet  Commonly known as:  ZOFRAN  Take 1 tablet (8 mg total) by mouth every 8 (eight) hours as needed for nausea or vomiting.     oxyCODONE 5 MG immediate release tablet  Commonly known as:  ROXICODONE  Take 1 tablet (5 mg total) by mouth every 4 (four) hours as needed.     polyethylene glycol packet  Commonly known as:  MIRALAX / GLYCOLAX  Take 17 g by mouth daily.     prochlorperazine 10 MG tablet  Commonly known as:  COMPAZINE  Take 1 tablet (10 mg total) by mouth every 6 (six) hours as needed for nausea or vomiting.     STIMULANT LAXATIVE 5 MG EC tablet  Generic drug:  bisacodyl  Take 10 mg by mouth daily as needed for mild constipation.     TURMERIC PO  Take 1 tablet by mouth daily.     zolpidem 5 MG tablet  Commonly known as:  AMBIEN  Take 1 tablet (5 mg total) by mouth at bedtime as needed for sleep.         PHYSICAL EXAMINATION Vitals:  BP 121/71, HR 98, temp 99,   Physical Exam  Nursing  note and vitals reviewed. Constitutional: He is oriented to person, place, and time. Vital signs are normal. He appears unhealthy.  HENT:  Head: Normocephalic.  Eyes: Conjunctivae and EOM are normal. Pupils are equal, round, and reactive to light. No scleral icterus.  Neck: Normal range of motion. No JVD present.  Pulmonary/Chest: Breath sounds normal. No respiratory distress.  Patient with mild shortness of breath noted on initial exam; but all symptoms did resolve and patient did return to room air only once hypersensitivity reaction controlled.  Neurological: He is alert and oriented to person, place, and time.  Skin: Skin is warm and dry. No rash noted. No erythema.  Psychiatric: Affect normal.    LABORATORY DATA:. Appointment on 01/14/2014  Component Date Value Ref Range Status  . WBC 01/14/2014 9.8  4.0 - 10.3 10e3/uL Final  . NEUT# 01/14/2014 7.9* 1.5 - 6.5 10e3/uL Final  . HGB 01/14/2014 11.0* 13.0 - 17.1 g/dL Final  . HCT 01/14/2014 35.6* 38.4 - 49.9 % Final  . Platelets 01/14/2014 263  140 - 400 10e3/uL Final  . MCV 01/14/2014 80.2  79.3 - 98.0 fL Final  . MCH 01/14/2014 24.8* 27.2 - 33.4 pg Final  . MCHC 01/14/2014 30.9* 32.0 - 36.0 g/dL Final  . RBC 01/14/2014 4.44  4.20 - 5.82 10e6/uL Final  . RDW 01/14/2014 22.8* 11.0 - 14.6 % Final  . lymph# 01/14/2014 1.1  0.9 - 3.3 10e3/uL Final  . MONO# 01/14/2014 0.8  0.1 - 0.9 10e3/uL Final  . Eosinophils Absolute 01/14/2014 0.1  0.0 - 0.5 10e3/uL Final  . Basophils Absolute 01/14/2014 0.0  0.0 - 0.1 10e3/uL Final  . NEUT% 01/14/2014 80.3* 39.0 - 75.0 % Final  . LYMPH% 01/14/2014 11.2* 14.0 - 49.0 % Final  . MONO% 01/14/2014 7.8  0.0 - 14.0 % Final  . EOS% 01/14/2014 0.5  0.0 - 7.0 % Final  . BASO% 01/14/2014 0.2  0.0 - 2.0 % Final  . Sodium 01/14/2014 135* 136 - 145 mEq/L Final  . Potassium 01/14/2014 4.6  3.5 - 5.1 mEq/L Final  . Chloride 01/14/2014 102  98 - 109 mEq/L Final  . CO2 01/14/2014 25  22 - 29 mEq/L Final  .  Glucose 01/14/2014 114  70 - 140 mg/dl Final  . BUN 01/14/2014 10.2  7.0 - 26.0 mg/dL Final  . Creatinine 01/14/2014 0.9  0.7 - 1.3 mg/dL Final  . Total Bilirubin 01/14/2014 0.51  0.20 - 1.20 mg/dL Final  . Alkaline Phosphatase 01/14/2014 66  40 - 150 U/L Final  . AST 01/14/2014 11  5 - 34 U/L Final  . ALT 01/14/2014 9  0 - 55 U/L Final  . Total Protein 01/14/2014 7.0  6.4 - 8.3 g/dL Final  . Albumin 01/14/2014 3.3* 3.5 - 5.0 g/dL Final  . Calcium 01/14/2014 9.6  8.4 - 10.4 mg/dL Final  . Anion Gap 01/14/2014 8  3 - 11 mEq/L Final     RADIOGRAPHIC STUDIES: No results found.  ASSESSMENT/PLAN:    Bladder cancer  Assessment & Plan Patient initiated neoadjuvant cisplatin/gemcitabine chemotherapy regimen today.  He did experience a hypersensitivity reaction to the anti--nausea medication Emend; which was managed per the hypersensitivity protocol.  Patient will proceed today with cycle 1, day 1 of his cisplatin/gemcitabine chemotherapy.  Patient will return in one week to receive cycle 1, day 8 of the same regimen.   Hypersensitivity reaction  Assessment & Plan  Patient presented today to receive his initial cycle of cisplatin/gemcitabine chemotherapy.  He developed a hypersensitivity reaction to the Emend anti-emetic; which consisted of facial flushing and shortness of breath.  Patient denied any other reactions whatsoever.  O2 sat did decrease to 87%; and patient was placed on oxygen at 4 L.  Emend infusion was held; and patient was given Solu-Medrol 125 mg IV, Benadryl 25 mg IV, and Pepcid 20 mg IV.  All hypersensitivity reaction symptoms resolved; and patient will proceed with his chemotherapy as previously directed.  Will place Emend on patient's allergy list.    Patient stated understanding of all instructions; and was in agreement with this plan of care. The patient knows to call the clinic with any problems, questions or concerns.   Review/collaboration with Dr. Alen Blew regarding all  aspects of patient's visit today.   Total time spent with patient was 25 minutes;  with greater than 75 percent of that time spent in face to face counseling regarding his symptoms, frequent monitoring of patient in the infusion area, and coordination of care and follow up.  Disclaimer: This note was dictated with voice recognition software. Similar sounding words can inadvertently be transcribed and may not be corrected upon review.   Drue Second, NP 01/14/2014

## 2014-01-14 NOTE — Assessment & Plan Note (Signed)
Patient presented today to receive his initial cycle of cisplatin/gemcitabine chemotherapy.  He developed a hypersensitivity reaction to the Emend anti-emetic; which consisted of facial flushing and shortness of breath.  Patient denied any other reactions whatsoever.  O2 sat did decrease to 87%; and patient was placed on oxygen at 4 L.  Emend infusion was held; and patient was given Solu-Medrol 125 mg IV, Benadryl 25 mg IV, and Pepcid 20 mg IV.  All hypersensitivity reaction symptoms resolved; and patient will proceed with his chemotherapy as previously directed.  Will place Emend on patient's allergy list.

## 2014-01-14 NOTE — Assessment & Plan Note (Signed)
Patient initiated neoadjuvant cisplatin/gemcitabine chemotherapy regimen today.  He did experience a hypersensitivity reaction to the anti--nausea medication Emend; which was managed per the hypersensitivity protocol.  Patient will proceed today with cycle 1, day 1 of his cisplatin/gemcitabine chemotherapy.  Patient will return in one week to receive cycle 1, day 8 of the same regimen.

## 2014-01-14 NOTE — Progress Notes (Signed)
Pre Cisplatin void was 375 mls.

## 2014-01-15 ENCOUNTER — Telehealth: Payer: Self-pay | Admitting: *Deleted

## 2014-01-15 ENCOUNTER — Telehealth: Payer: Self-pay | Admitting: Medical Oncology

## 2014-01-15 NOTE — Telephone Encounter (Signed)
F/U call to patient regarding message from [facility]. Per wife, patient woke up with headache this morning but has now resolved after taking tylenol and patient reports feeling better, continues with constipation though. Encouraged to continue with Miralax and bisacodyl as directed. Also encouraged to call office/clinic with any questions/concerns.

## 2014-01-15 NOTE — Telephone Encounter (Signed)
Left VM for patient to call back regarding status. Noted she had allergic reaction to Emend yesterday.

## 2014-01-20 ENCOUNTER — Other Ambulatory Visit: Payer: Self-pay | Admitting: Oncology

## 2014-01-20 DIAGNOSIS — C679 Malignant neoplasm of bladder, unspecified: Secondary | ICD-10-CM

## 2014-01-21 ENCOUNTER — Ambulatory Visit (HOSPITAL_BASED_OUTPATIENT_CLINIC_OR_DEPARTMENT_OTHER): Payer: BC Managed Care – PPO

## 2014-01-21 ENCOUNTER — Ambulatory Visit (HOSPITAL_BASED_OUTPATIENT_CLINIC_OR_DEPARTMENT_OTHER): Payer: BC Managed Care – PPO | Admitting: Oncology

## 2014-01-21 VITALS — BP 128/77 | HR 86 | Temp 98.2°F | Resp 21 | Ht 66.0 in | Wt 184.2 lb

## 2014-01-21 DIAGNOSIS — C679 Malignant neoplasm of bladder, unspecified: Secondary | ICD-10-CM

## 2014-01-21 DIAGNOSIS — Z5111 Encounter for antineoplastic chemotherapy: Secondary | ICD-10-CM

## 2014-01-21 DIAGNOSIS — D509 Iron deficiency anemia, unspecified: Secondary | ICD-10-CM

## 2014-01-21 LAB — COMPREHENSIVE METABOLIC PANEL (CC13)
ALT: 19 U/L (ref 0–55)
AST: 15 U/L (ref 5–34)
Albumin: 3.3 g/dL — ABNORMAL LOW (ref 3.5–5.0)
Alkaline Phosphatase: 66 U/L (ref 40–150)
Anion Gap: 12 meq/L — ABNORMAL HIGH (ref 3–11)
BUN: 12.9 mg/dL (ref 7.0–26.0)
CO2: 27 meq/L (ref 22–29)
Calcium: 9.4 mg/dL (ref 8.4–10.4)
Chloride: 93 meq/L — ABNORMAL LOW (ref 98–109)
Creatinine: 1.2 mg/dL (ref 0.7–1.3)
Glucose: 99 mg/dL (ref 70–140)
Potassium: 3.6 meq/L (ref 3.5–5.1)
Sodium: 132 meq/L — ABNORMAL LOW (ref 136–145)
Total Bilirubin: 0.5 mg/dL (ref 0.20–1.20)
Total Protein: 7.3 g/dL (ref 6.4–8.3)

## 2014-01-21 LAB — CBC WITH DIFFERENTIAL/PLATELET
BASO%: 0.8 % (ref 0.0–2.0)
Basophils Absolute: 0.1 10e3/uL (ref 0.0–0.1)
EOS%: 2 % (ref 0.0–7.0)
Eosinophils Absolute: 0.1 10e3/uL (ref 0.0–0.5)
HCT: 34.6 % — ABNORMAL LOW (ref 38.4–49.9)
HGB: 11.2 g/dL — ABNORMAL LOW (ref 13.0–17.1)
LYMPH%: 15.4 % (ref 14.0–49.0)
MCH: 24.6 pg — ABNORMAL LOW (ref 27.2–33.4)
MCHC: 32.3 g/dL (ref 32.0–36.0)
MCV: 76.1 fL — ABNORMAL LOW (ref 79.3–98.0)
MONO#: 0.3 10e3/uL (ref 0.1–0.9)
MONO%: 4.4 % (ref 0.0–14.0)
NEUT#: 5.1 10e3/uL (ref 1.5–6.5)
NEUT%: 77.4 % — ABNORMAL HIGH (ref 39.0–75.0)
Platelets: 267 10e3/uL (ref 140–400)
RBC: 4.54 10e6/uL (ref 4.20–5.82)
RDW: 26.1 % — ABNORMAL HIGH (ref 11.0–14.6)
WBC: 6.5 10e3/uL (ref 4.0–10.3)
lymph#: 1 10e3/uL (ref 0.9–3.3)

## 2014-01-21 MED ORDER — SODIUM CHLORIDE 0.9 % IV SOLN
1000.0000 mg/m2 | Freq: Once | INTRAVENOUS | Status: AC
  Administered 2014-01-21: 2052 mg via INTRAVENOUS
  Filled 2014-01-21: qty 53.97

## 2014-01-21 MED ORDER — SODIUM CHLORIDE 0.9 % IJ SOLN
10.0000 mL | INTRAMUSCULAR | Status: DC | PRN
  Administered 2014-01-21: 10 mL
  Filled 2014-01-21: qty 10

## 2014-01-21 MED ORDER — SODIUM CHLORIDE 0.9 % IV SOLN
Freq: Once | INTRAVENOUS | Status: AC
  Administered 2014-01-21: 10:00:00 via INTRAVENOUS

## 2014-01-21 MED ORDER — ACETAMINOPHEN 325 MG PO TABS
ORAL_TABLET | ORAL | Status: AC
  Filled 2014-01-21: qty 2

## 2014-01-21 MED ORDER — PROCHLORPERAZINE MALEATE 10 MG PO TABS
ORAL_TABLET | ORAL | Status: AC
  Filled 2014-01-21: qty 1

## 2014-01-21 MED ORDER — HEPARIN SOD (PORK) LOCK FLUSH 100 UNIT/ML IV SOLN
500.0000 [IU] | Freq: Once | INTRAVENOUS | Status: AC | PRN
  Administered 2014-01-21: 500 [IU]
  Filled 2014-01-21: qty 5

## 2014-01-21 MED ORDER — LORAZEPAM 1 MG PO TABS: 1.0000 mg | ORAL_TABLET | Freq: Four times a day (QID) | ORAL | Status: DC | PRN

## 2014-01-21 MED ORDER — DEXAMETHASONE 4 MG PO TABS: ORAL_TABLET | ORAL | Status: DC

## 2014-01-21 MED ORDER — PROCHLORPERAZINE MALEATE 10 MG PO TABS
10.0000 mg | ORAL_TABLET | Freq: Once | ORAL | Status: AC
  Administered 2014-01-21: 10 mg via ORAL

## 2014-01-21 NOTE — Progress Notes (Signed)
Hematology and Oncology Follow Up Visit  Erik Gross [id number] 12-Mar-1956 58 y.o. 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 He has a 5.3 x 4.0 x 5.6 cm diameter mass at the left aspect of the urinary bladder. This was initially noted back in December of 2014    Prior Therapy:  He underwent a TURBT and postoperative mitomycin-C on 02/25/2013. Retrograde pyelograms were negative at that time. He underwent a repeat TURBT of a more than a 5 cm large tumor and clot evacuation on 12/16/2013. The pathology showed a high-grade invasive transitional cell carcinoma.  Current therapy: neoadjuvant systemic chemotherapy with cisplatin and gemcitabine. Cisplatin and gemcitabine given on day 1 with gemcitabine given on day 8. He started on 01/14/2014. Today is day 8 of cycle 1.  Interim History: Erik Gross presents today for a followup visit. Since the last visit, he received the first day of the first cycle of chemotherapy with cisplatin and gemcitabine. He did have a reaction to Emend antiemetics and required Solu-Medrol premedication. He did have delayed nausea for 3-4 days afterwards. He had poor by mouth intake and some fatigue. He did not have any vomiting or peripheral neuropathy. He did not have any hearing deficits. He is feeling better last few days. He is reporting fair amount of anxiety and sadness associated with his condition. He is feeling better the last 24-48 hours however. He is no longer reporting any hematuria but does report frequency. He is regaining most activities of daily living. He is not reporting any headaches or blurry vision or syncope. He does not report any fevers or chills or sweats or loss. He does not report any abdominal pain. He does not report any chest pain or palpitation or orthopnea. He did not report any wheezing or  cough or hemoptysis.  He did not report any flank pain or pelvic pain. He does not report any lower extremity edema or change in his mentation. He does not report any skeletal complaints. He continues to be in excellent health and excellent performance status. Rest of his review of systems unremarkable.   Medications: I have reviewed the patient's current medications.  Current Outpatient Prescriptions  Medication Sig Dispense Refill  . acetaminophen (TYLENOL) 500 MG tablet Take 500 mg by mouth every 4 (four) hours as needed for mild pain or headache.     . bisacodyl (STIMULANT LAXATIVE) 5 MG EC tablet Take 10 mg by mouth daily as needed for mild constipation.     . calcium carbonate (TUMS - DOSED IN MG ELEMENTAL CALCIUM) 500 MG chewable tablet Chew 1 tablet by mouth daily.    Marland Kitchen dexamethasone (DECADRON) 4 MG tablet Take one tablet every 8 hours for 3 days after chemotherapy. 30 tablet 0  . docusate sodium (COLACE) 100 MG capsule Take 1 capsule (100 mg total) by mouth 2 (two) times daily as needed (take to keep stool soft.). 60 capsule 0  . Influenza Vac Split Quad (FLUZONE) 0.25 ML injection Inject 0.25 mLs into the muscle once.    . lidocaine-prilocaine (EMLA) cream Apply 1 application topically as needed. Apply to port on the day of chemotherapy. 30 g 0  . LORazepam (ATIVAN) 1 MG tablet Take 1 tablet (1 mg total) by mouth every 6 (six) hours as needed for anxiety. 30 tablet 0  . Multiple Vitamin (MULTIVITAMIN) tablet Take 1 tablet by  mouth daily.    . ondansetron (ZOFRAN) 8 MG tablet Take 1 tablet (8 mg total) by mouth every 8 (eight) hours as needed for nausea or vomiting. 20 tablet 0  . oxyCODONE (ROXICODONE) 5 MG immediate release tablet Take 1 tablet (5 mg total) by mouth every 4 (four) hours as needed. 15 tablet 0  . polyethylene glycol (MIRALAX / GLYCOLAX) packet Take 17 g by mouth daily.    . prochlorperazine (COMPAZINE) 10 MG tablet Take 1 tablet (10 mg total) by mouth every 6 (six) hours  as needed for nausea or vomiting. 30 tablet 0  . TOVIAZ 4 MG TB24 tablet Take 4 mg by mouth daily.  3  . TURMERIC PO Take 1 tablet by mouth daily.    . Wheat Dextrin (BENEFIBER DRINK MIX PO) Take 1 packet by mouth daily.     Marland Kitchen zolpidem (AMBIEN) 5 MG tablet Take 1 tablet (5 mg total) by mouth at bedtime as needed for sleep. 30 tablet 0   No current facility-administered medications for this visit.     Allergies:  Allergies  Allergen Reactions  . Emend [Fosaprepitant] Shortness Of Breath and Other (See Comments)    Bright red face.    Past Medical History, Surgical history, Social history, and Family History were reviewed and updated.   Physical Exam: Blood pressure 128/77, pulse 86, temperature 98.2 F (36.8 C), temperature source Oral, resp. rate 21, height 5\' 6"  (1.676 m), weight 184 lb 3.2 oz (83.553 kg), SpO2 100 %. ECOG: 0 General appearance: alert and cooperative Head: Normocephalic, without obvious abnormality Neck: no adenopathy Lymph nodes: Cervical, supraclavicular, and axillary nodes normal. Heart:regular rate and rhythm, S1, S2 normal, no murmur, click, rub or gallop Lung:chest clear, no wheezing, rales, normal symmetric air entry Abdomin: soft, non-tender, without masses or organomegaly EXT:no erythema, induration, or nodules   Lab Results: Lab Results  Component Value Date   WBC 6.5 01/21/2014   HGB 11.2* 01/21/2014   HCT 34.6* 01/21/2014   MCV 76.1* 01/21/2014   PLT 267 01/21/2014     Chemistry      Component Value Date/Time   NA 132* 01/21/2014 0912   NA 136* 01/05/2014 1302   K 3.6 01/21/2014 0912   K 4.2 01/05/2014 1302   CL 99 01/05/2014 1302   CO2 27 01/21/2014 0912   CO2 24 01/05/2014 1302   BUN 12.9 01/21/2014 0912   BUN 11 01/05/2014 1302   CREATININE 1.2 01/21/2014 0912   CREATININE 0.84 01/05/2014 1302      Component Value Date/Time   CALCIUM 9.4 01/21/2014 0912   CALCIUM 9.2 01/05/2014 1302   ALKPHOS 66 01/21/2014 0912   ALKPHOS  46 12/15/2013 0550   AST 15 01/21/2014 0912   AST 10 12/15/2013 0550   ALT 19 01/21/2014 0912   ALT 7 12/15/2013 0550   BILITOT 0.50 01/21/2014 0912   BILITOT 0.4 12/15/2013 0550       Impression and Plan:  57 year old gentleman with the following issues:  1. 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 He has a 5.3 x 4.0 x 5.6 cm diameter mass at the left aspect of the urinary bladder.   He is currently receiving neoadjuvant systemic chemotherapy with cisplatin and Gemzar. He is ready to proceed with day 8 of cycle 1. His laboratory data were reviewed today and appears to be adequate. He did have significant nausea associated with cisplatin and for that reason I  will dose reduce him for cycle 2 I will also give him dexamethasone for delayed nausea. He will take dexamethasone at 4 mg every 8 hours for 3 days after cis-platinum administration. I also given prescription for Ativan for anxiety and nausea prophylaxis.  2. Iron deficiency anemia: his hemoglobin is much improved.  3. IV access: Port-A-Cath in place without any complication.  4. Anti-emetics: Prescription for dexamethasone and Ativan were added today.  5. Followup: Will be in 2 weeks for day 1 of cycle 2. The plan is for him to complete total of 3 cycles.    [id number],SUORV, MD 11/5/20159:54 AM

## 2014-01-21 NOTE — Patient Instructions (Signed)
[facility] Discharge Instructions for Patients Receiving Chemotherapy  Today you received the following chemotherapy agent Gemzar.  To help prevent nausea and vomiting after your treatment, we encourage you to take your nausea medication.   If you develop nausea and vomiting that is not controlled by your nausea medication, call the clinic.   BELOW ARE SYMPTOMS THAT SHOULD BE REPORTED IMMEDIATELY:  *FEVER GREATER THAN 100.5 F  *CHILLS WITH OR WITHOUT FEVER  NAUSEA AND VOMITING THAT IS NOT CONTROLLED WITH YOUR NAUSEA MEDICATION  *UNUSUAL SHORTNESS OF BREATH  *UNUSUAL BRUISING OR BLEEDING  TENDERNESS IN MOUTH AND THROAT WITH OR WITHOUT PRESENCE OF ULCERS  *URINARY PROBLEMS  *BOWEL PROBLEMS  UNUSUAL RASH Items with * indicate a potential emergency and should be followed up as soon as possible.  Feel free to call the clinic you have any questions or concerns. The clinic phone number is [phone].    

## 2014-02-04 ENCOUNTER — Encounter: Payer: Self-pay | Admitting: Physician Assistant

## 2014-02-04 ENCOUNTER — Other Ambulatory Visit (HOSPITAL_BASED_OUTPATIENT_CLINIC_OR_DEPARTMENT_OTHER): Payer: BC Managed Care – PPO

## 2014-02-04 ENCOUNTER — Other Ambulatory Visit: Payer: BC Managed Care – PPO

## 2014-02-04 ENCOUNTER — Ambulatory Visit: Payer: BC Managed Care – PPO | Admitting: Physician Assistant

## 2014-02-04 ENCOUNTER — Ambulatory Visit (HOSPITAL_BASED_OUTPATIENT_CLINIC_OR_DEPARTMENT_OTHER): Payer: BC Managed Care – PPO

## 2014-02-04 ENCOUNTER — Ambulatory Visit (HOSPITAL_BASED_OUTPATIENT_CLINIC_OR_DEPARTMENT_OTHER): Payer: BC Managed Care – PPO | Admitting: Physician Assistant

## 2014-02-04 VITALS — BP 135/66 | HR 92 | Temp 98.1°F | Resp 18 | Ht 66.0 in | Wt 190.2 lb

## 2014-02-04 DIAGNOSIS — D509 Iron deficiency anemia, unspecified: Secondary | ICD-10-CM

## 2014-02-04 DIAGNOSIS — Z5111 Encounter for antineoplastic chemotherapy: Secondary | ICD-10-CM

## 2014-02-04 DIAGNOSIS — C679 Malignant neoplasm of bladder, unspecified: Secondary | ICD-10-CM

## 2014-02-04 DIAGNOSIS — D500 Iron deficiency anemia secondary to blood loss (chronic): Secondary | ICD-10-CM

## 2014-02-04 LAB — CBC WITH DIFFERENTIAL/PLATELET
BASO%: 1.3 % (ref 0.0–2.0)
BASOS ABS: 0 10*3/uL (ref 0.0–0.1)
EOS ABS: 0.1 10*3/uL (ref 0.0–0.5)
EOS%: 2 % (ref 0.0–7.0)
HEMATOCRIT: 34.4 % — AB (ref 38.4–49.9)
HEMOGLOBIN: 10.9 g/dL — AB (ref 13.0–17.1)
LYMPH#: 1.1 10*3/uL (ref 0.9–3.3)
LYMPH%: 28.8 % (ref 14.0–49.0)
MCH: 25.6 pg — ABNORMAL LOW (ref 27.2–33.4)
MCHC: 31.8 g/dL — ABNORMAL LOW (ref 32.0–36.0)
MCV: 80.5 fL (ref 79.3–98.0)
MONO#: 0.5 10*3/uL (ref 0.1–0.9)
MONO%: 12.3 % (ref 0.0–14.0)
NEUT#: 2.1 10*3/uL (ref 1.5–6.5)
NEUT%: 55.6 % (ref 39.0–75.0)
Platelets: 480 10*3/uL — ABNORMAL HIGH (ref 140–400)
RBC: 4.27 10*6/uL (ref 4.20–5.82)
RDW: 28.6 % — ABNORMAL HIGH (ref 11.0–14.6)
WBC: 3.8 10*3/uL — ABNORMAL LOW (ref 4.0–10.3)

## 2014-02-04 LAB — COMPREHENSIVE METABOLIC PANEL (CC13)
ALT: 11 U/L (ref 0–55)
AST: 14 U/L (ref 5–34)
Albumin: 3.3 g/dL — ABNORMAL LOW (ref 3.5–5.0)
Alkaline Phosphatase: 65 U/L (ref 40–150)
Anion Gap: 8 mEq/L (ref 3–11)
BUN: 9.9 mg/dL (ref 7.0–26.0)
CHLORIDE: 104 meq/L (ref 98–109)
CO2: 25 mEq/L (ref 22–29)
CREATININE: 0.9 mg/dL (ref 0.7–1.3)
Calcium: 9.2 mg/dL (ref 8.4–10.4)
GLUCOSE: 158 mg/dL — AB (ref 70–140)
Potassium: 3.9 mEq/L (ref 3.5–5.1)
Sodium: 138 mEq/L (ref 136–145)
Total Bilirubin: 0.24 mg/dL (ref 0.20–1.20)
Total Protein: 6.3 g/dL — ABNORMAL LOW (ref 6.4–8.3)

## 2014-02-04 MED ORDER — SODIUM CHLORIDE 0.9 % IJ SOLN
10.0000 mL | INTRAMUSCULAR | Status: DC | PRN
  Administered 2014-02-04: 10 mL
  Filled 2014-02-04: qty 10

## 2014-02-04 MED ORDER — ONDANSETRON HCL 8 MG PO TABS: 8.0000 mg | ORAL_TABLET | Freq: Three times a day (TID) | ORAL | Status: DC | PRN

## 2014-02-04 MED ORDER — SODIUM CHLORIDE 0.9 % IV SOLN
1000.0000 mg/m2 | Freq: Once | INTRAVENOUS | Status: AC
  Administered 2014-02-04: 2052 mg via INTRAVENOUS
  Filled 2014-02-04: qty 53.97

## 2014-02-04 MED ORDER — DEXAMETHASONE SODIUM PHOSPHATE 20 MG/5ML IJ SOLN
12.0000 mg | Freq: Once | INTRAMUSCULAR | Status: AC
  Administered 2014-02-04: 12 mg via INTRAVENOUS

## 2014-02-04 MED ORDER — PROCHLORPERAZINE MALEATE 10 MG PO TABS: 10.0000 mg | ORAL_TABLET | Freq: Four times a day (QID) | ORAL | Status: DC | PRN

## 2014-02-04 MED ORDER — PALONOSETRON HCL INJECTION 0.25 MG/5ML
INTRAVENOUS | Status: AC
  Filled 2014-02-04: qty 5

## 2014-02-04 MED ORDER — DEXAMETHASONE SODIUM PHOSPHATE 20 MG/5ML IJ SOLN
INTRAMUSCULAR | Status: AC
  Filled 2014-02-04: qty 5

## 2014-02-04 MED ORDER — POTASSIUM CHLORIDE 2 MEQ/ML IV SOLN
Freq: Once | INTRAVENOUS | Status: AC
  Administered 2014-02-04: 10:00:00 via INTRAVENOUS
  Filled 2014-02-04: qty 10

## 2014-02-04 MED ORDER — SODIUM CHLORIDE 0.9 % IV SOLN
Freq: Once | INTRAVENOUS | Status: AC
  Administered 2014-02-04: 10:00:00 via INTRAVENOUS

## 2014-02-04 MED ORDER — HEPARIN SOD (PORK) LOCK FLUSH 100 UNIT/ML IV SOLN
500.0000 [IU] | Freq: Once | INTRAVENOUS | Status: AC | PRN
  Administered 2014-02-04: 500 [IU]
  Filled 2014-02-04: qty 5

## 2014-02-04 MED ORDER — PALONOSETRON HCL INJECTION 0.25 MG/5ML
0.2500 mg | Freq: Once | INTRAVENOUS | Status: AC
  Administered 2014-02-04: 0.25 mg via INTRAVENOUS

## 2014-02-04 MED ORDER — SODIUM CHLORIDE 0.9 % IV SOLN
75.0000 mg/m2 | Freq: Once | INTRAVENOUS | Status: AC
  Administered 2014-02-04: 153 mg via INTRAVENOUS
  Filled 2014-02-04: qty 153

## 2014-02-04 NOTE — Patient Instructions (Addendum)
[facility] Discharge Instructions for Patients Receiving Chemotherapy  Today you received the following chemotherapy agents: Cisplatin and Gemzar   To help prevent nausea and vomiting after your treatment, we encourage you to take your nausea medication as prescribed.  Ondansetron (Zofran): begin taking this 3 days after chemotherapy.  An IV medication (Aloxi) acts the same way as this medication.  Prochlorperazine (Compazine): you may take this starting now, as needed for nausea. He has not received any of this while in the infusion room. This may cause drowsiness.    If you develop nausea and vomiting that is not controlled by your nausea medication, call the clinic.   BELOW ARE SYMPTOMS THAT SHOULD BE REPORTED IMMEDIATELY:  *FEVER GREATER THAN 100.5 F  *CHILLS WITH OR WITHOUT FEVER  NAUSEA AND VOMITING THAT IS NOT CONTROLLED WITH YOUR NAUSEA MEDICATION  *UNUSUAL SHORTNESS OF BREATH  *UNUSUAL BRUISING OR BLEEDING  TENDERNESS IN MOUTH AND THROAT WITH OR WITHOUT PRESENCE OF ULCERS  *URINARY PROBLEMS  *BOWEL PROBLEMS  UNUSUAL RASH Items with * indicate a potential emergency and should be followed up as soon as possible.  Feel free to call the clinic you have any questions or concerns. The clinic phone number is [phone].

## 2014-02-04 NOTE — Progress Notes (Signed)
Hematology and Oncology Follow Up Visit  [name] [id number] September 19, 1955 58 y.o. 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 He has a 5.3 x 4.0 x 5.6 cm diameter mass at the left aspect of the urinary bladder. This was initially noted back in December of 2014    Prior Therapy:  He underwent a TURBT and postoperative mitomycin-C on 02/25/2013. Retrograde pyelograms were negative at that time. He underwent a repeat TURBT of a more than a 5 cm large tumor and clot evacuation on 12/16/2013. The pathology showed a high-grade invasive transitional cell carcinoma.  Current therapy: neoadjuvant systemic chemotherapy with cisplatin and gemcitabine. Cisplatin and gemcitabine given on day 1 with gemcitabine given on day 8. He started on 01/14/2014. Today is day 1 of cycle 2.  Interim History: [name] presents today for a followup visit. He is status post one cycle of systemic chemotherapy with cisplatin and gemcitabine. Since the last visit, he received the first day of the first cycle of chemotherapy with cisplatin and gemcitabine. He did have a reaction to Emend antiemetics and required Solu-Medrol premedication. He did have delayed nausea for 3-4 days afterwards. He had poor by mouth intake and some fatigue. He did not have any vomiting or peripheral neuropathy. He did not have any hearing deficits. He is feeling better over the last few days.  He is regaining most activities of daily living. He is not reporting any headaches or blurry vision or syncope. He does not report any fevers or chills or sweats or loss. He does not report any abdominal pain. He does not report any chest pain or palpitation or orthopnea. He did not report any wheezing or cough or hemoptysis.  He did not report any flank pain or pelvic pain. He does not report any lower  extremity edema or change in his mentation. He does not report any skeletal complaints. He continues to be in excellent health and excellent performance status. Remainder of his review of systems unremarkable.   Medications: I have reviewed the patient's current medications.  Current Outpatient Prescriptions  Medication Sig Dispense Refill  . acetaminophen (TYLENOL) 500 MG tablet Take 500 mg by mouth every 4 (four) hours as needed for mild pain or headache.     . calcium carbonate (TUMS - DOSED IN MG ELEMENTAL CALCIUM) 500 MG chewable tablet Chew 1 tablet by mouth daily.    Marland Kitchen dexamethasone (DECADRON) 4 MG tablet Take one tablet every 8 hours for 3 days after chemotherapy. 30 tablet 0  . lidocaine-prilocaine (EMLA) cream Apply 1 application topically as needed. Apply to port on the day of chemotherapy. 30 g 0  . LORazepam (ATIVAN) 1 MG tablet Take 1 tablet (1 mg total) by mouth every 6 (six) hours as needed for anxiety. 30 tablet 0  . Multiple Vitamin (MULTIVITAMIN) tablet Take 1 tablet by mouth daily.    . ondansetron (ZOFRAN) 8 MG tablet Take 1 tablet (8 mg total) by mouth every 8 (eight) hours as needed for nausea or vomiting. 30 tablet 1  . polyethylene glycol (MIRALAX / GLYCOLAX) packet Take 17 g by mouth daily.    . prochlorperazine (COMPAZINE) 10 MG tablet Take 1 tablet (10 mg total) by mouth every 6 (six) hours as needed for nausea or vomiting. 30 tablet 1  . TURMERIC PO Take 1 tablet by mouth daily.    Marland Kitchen  zolpidem (AMBIEN) 5 MG tablet Take 1 tablet (5 mg total) by mouth at bedtime as needed for sleep. 30 tablet 0  . bisacodyl (STIMULANT LAXATIVE) 5 MG EC tablet Take 10 mg by mouth daily as needed for mild constipation.     . docusate sodium (COLACE) 100 MG capsule Take 1 capsule (100 mg total) by mouth 2 (two) times daily as needed (take to keep stool soft.). 60 capsule 0  . Influenza Vac Split Quad (FLUZONE) 0.25 ML injection Inject 0.25 mLs into the muscle once.    Marland Kitchen oxyCODONE  (ROXICODONE) 5 MG immediate release tablet Take 1 tablet (5 mg total) by mouth every 4 (four) hours as needed. 15 tablet 0  . TOVIAZ 4 MG TB24 tablet Take 4 mg by mouth daily.  3  . Wheat Dextrin (BENEFIBER DRINK MIX PO) Take 1 packet by mouth daily.      No current facility-administered medications for this visit.   Facility-Administered Medications Ordered in Other Visits  Medication Dose Route Frequency Provider Last Rate Last Dose  . heparin lock flush 100 unit/mL  500 Units Intracatheter Once PRN Wyatt Portela, MD      . sodium chloride 0.9 % injection 10 mL  10 mL Intracatheter PRN Wyatt Portela, MD         Allergies:  Allergies  Allergen Reactions  . Emend [Fosaprepitant] Shortness Of Breath and Other (See Comments)    Bright red face.    Past Medical History, Surgical history, Social history, and Family History were reviewed and updated.   Physical Exam: Blood pressure 135/66, pulse 92, temperature 98.1 F (36.7 C), temperature source Oral, resp. rate 18, height 5\' 6"  (1.676 m), weight 190 lb 3.2 oz (86.274 kg), SpO2 100 %. ECOG: 0 General appearance: alert and cooperative Head: Normocephalic, without obvious abnormality Neck: no adenopathy Lymph nodes: Cervical, supraclavicular, and axillary nodes normal. Heart:regular rate and rhythm, S1, S2 normal, no murmur, click, rub or gallop Lung:chest clear, no wheezing, rales, normal symmetric air entry Abdomin: soft, non-tender, without masses or organomegaly EXT:no erythema, induration, or nodules   Lab Results: Lab Results  Component Value Date   WBC 3.8* 02/04/2014   HGB 10.9* 02/04/2014   HCT 34.4* 02/04/2014   MCV 80.5 02/04/2014   PLT 480* 02/04/2014     Chemistry      Component Value Date/Time   NA 138 02/04/2014 0855   NA 136* 01/05/2014 1302   K 3.9 02/04/2014 0855   K 4.2 01/05/2014 1302   CL 99 01/05/2014 1302   CO2 25 02/04/2014 0855   CO2 24 01/05/2014 1302   BUN 9.9 02/04/2014 0855   BUN 11  01/05/2014 1302   CREATININE 0.9 02/04/2014 0855   CREATININE 0.84 01/05/2014 1302      Component Value Date/Time   CALCIUM 9.2 02/04/2014 0855   CALCIUM 9.2 01/05/2014 1302   ALKPHOS 65 02/04/2014 0855   ALKPHOS 46 12/15/2013 0550   AST 14 02/04/2014 0855   AST 10 12/15/2013 0550   ALT 11 02/04/2014 0855   ALT 7 12/15/2013 0550   BILITOT 0.24 02/04/2014 0855   BILITOT 0.4 12/15/2013 0550       Impression and Plan:  58 year old gentleman with the following issues:  1. 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 He has a 5.3 x 4.0 x 5.6 cm diameter mass at the left aspect of the urinary bladder.   He is currently  receiving neoadjuvant systemic chemotherapy with cisplatin and Gemzar.  His laboratory data were reviewed today and appears to be adequate. He is ready to proceed with day 1 of cycle 2. He did have significant nausea associated with cisplatin and for that reason his dose was reduced for cycle 2. He will continue dexamethasone for delayed nausea. He will take dexamethasone at 4 mg every 8 hours for 3 days after cis-platinum administration. He'll continue Ativan for anxiety and nausea prophylaxis.  2. Iron deficiency anemia: his hemoglobin is much improved.  3. IV access: Port-A-Cath in place without any complication.  4. Anti-emetics: Refill prescriptions for Zofran and Compazine were sent to pharmacy record via [place]. Prescription for dexamethasone and Ativan were added today.  5. Followup: Will be in  1 week for day 8 of cycle 2. The plan is for him to complete total of 3 cycles.    Jahron Hunsinger E, PA-C  11/19/20152:16 PM

## 2014-02-07 NOTE — Patient Instructions (Signed)
Continue labs and chemotherapy is scheduled Follow-up in 1 week

## 2014-02-12 ENCOUNTER — Ambulatory Visit (HOSPITAL_BASED_OUTPATIENT_CLINIC_OR_DEPARTMENT_OTHER): Payer: BC Managed Care – PPO

## 2014-02-12 ENCOUNTER — Ambulatory Visit (HOSPITAL_BASED_OUTPATIENT_CLINIC_OR_DEPARTMENT_OTHER): Payer: BC Managed Care – PPO | Admitting: Oncology

## 2014-02-12 ENCOUNTER — Other Ambulatory Visit: Payer: Self-pay | Admitting: Medical Oncology

## 2014-02-12 ENCOUNTER — Telehealth: Payer: Self-pay | Admitting: Oncology

## 2014-02-12 ENCOUNTER — Other Ambulatory Visit (HOSPITAL_BASED_OUTPATIENT_CLINIC_OR_DEPARTMENT_OTHER): Payer: BC Managed Care – PPO

## 2014-02-12 ENCOUNTER — Telehealth: Payer: Self-pay | Admitting: *Deleted

## 2014-02-12 VITALS — BP 128/82 | HR 84 | Temp 97.6°F | Resp 18 | Ht 66.0 in | Wt 190.3 lb

## 2014-02-12 DIAGNOSIS — D500 Iron deficiency anemia secondary to blood loss (chronic): Secondary | ICD-10-CM

## 2014-02-12 DIAGNOSIS — D509 Iron deficiency anemia, unspecified: Secondary | ICD-10-CM

## 2014-02-12 DIAGNOSIS — C679 Malignant neoplasm of bladder, unspecified: Secondary | ICD-10-CM

## 2014-02-12 DIAGNOSIS — Z5111 Encounter for antineoplastic chemotherapy: Secondary | ICD-10-CM

## 2014-02-12 LAB — COMPREHENSIVE METABOLIC PANEL (CC13)
ALBUMIN: 3.4 g/dL — AB (ref 3.5–5.0)
ALT: 24 U/L (ref 0–55)
AST: 15 U/L (ref 5–34)
Alkaline Phosphatase: 64 U/L (ref 40–150)
Anion Gap: 6 mEq/L (ref 3–11)
BUN: 16.7 mg/dL (ref 7.0–26.0)
CALCIUM: 9.6 mg/dL (ref 8.4–10.4)
CO2: 26 meq/L (ref 22–29)
Chloride: 101 mEq/L (ref 98–109)
Creatinine: 0.8 mg/dL (ref 0.7–1.3)
Glucose: 77 mg/dl (ref 70–140)
POTASSIUM: 4.6 meq/L (ref 3.5–5.1)
SODIUM: 133 meq/L — AB (ref 136–145)
TOTAL PROTEIN: 6.5 g/dL (ref 6.4–8.3)
Total Bilirubin: 0.28 mg/dL (ref 0.20–1.20)

## 2014-02-12 LAB — CBC WITH DIFFERENTIAL/PLATELET
BASO%: 1 % (ref 0.0–2.0)
BASOS ABS: 0.1 10*3/uL (ref 0.0–0.1)
EOS%: 0.5 % (ref 0.0–7.0)
Eosinophils Absolute: 0 10*3/uL (ref 0.0–0.5)
HEMATOCRIT: 36.1 % — AB (ref 38.4–49.9)
HGB: 11.6 g/dL — ABNORMAL LOW (ref 13.0–17.1)
LYMPH%: 21.1 % (ref 14.0–49.0)
MCH: 25.6 pg — AB (ref 27.2–33.4)
MCHC: 32.1 g/dL (ref 32.0–36.0)
MCV: 79.9 fL (ref 79.3–98.0)
MONO#: 0.7 10*3/uL (ref 0.1–0.9)
MONO%: 8 % (ref 0.0–14.0)
NEUT#: 5.7 10*3/uL (ref 1.5–6.5)
NEUT%: 69.4 % (ref 39.0–75.0)
Platelets: 277 10*3/uL (ref 140–400)
RBC: 4.52 10*6/uL (ref 4.20–5.82)
RDW: 29.4 % — ABNORMAL HIGH (ref 11.0–14.6)
WBC: 8.3 10*3/uL (ref 4.0–10.3)
lymph#: 1.7 10*3/uL (ref 0.9–3.3)

## 2014-02-12 MED ORDER — LORAZEPAM 1 MG PO TABS: 1.0000 mg | ORAL_TABLET | Freq: Four times a day (QID) | ORAL | Status: DC | PRN

## 2014-02-12 MED ORDER — PROCHLORPERAZINE MALEATE 10 MG PO TABS
ORAL_TABLET | ORAL | Status: AC
  Filled 2014-02-12: qty 1

## 2014-02-12 MED ORDER — PROCHLORPERAZINE MALEATE 10 MG PO TABS
10.0000 mg | ORAL_TABLET | Freq: Once | ORAL | Status: AC
  Administered 2014-02-12: 10 mg via ORAL

## 2014-02-12 MED ORDER — HEPARIN SOD (PORK) LOCK FLUSH 100 UNIT/ML IV SOLN
500.0000 [IU] | Freq: Once | INTRAVENOUS | Status: AC | PRN
  Administered 2014-02-12: 500 [IU]
  Filled 2014-02-12: qty 5

## 2014-02-12 MED ORDER — DEXAMETHASONE 4 MG PO TABS: ORAL_TABLET | ORAL | Status: DC

## 2014-02-12 MED ORDER — SODIUM CHLORIDE 0.9 % IJ SOLN
10.0000 mL | INTRAMUSCULAR | Status: DC | PRN
  Administered 2014-02-12: 10 mL
  Filled 2014-02-12: qty 10

## 2014-02-12 MED ORDER — GEMCITABINE HCL CHEMO INJECTION 1 GM/26.3ML
1000.0000 mg/m2 | Freq: Once | INTRAVENOUS | Status: AC
  Administered 2014-02-12: 2052 mg via INTRAVENOUS
  Filled 2014-02-12: qty 53.97

## 2014-02-12 MED ORDER — SODIUM CHLORIDE 0.9 % IV SOLN
Freq: Once | INTRAVENOUS | Status: AC
  Administered 2014-02-12: 10:00:00 via INTRAVENOUS

## 2014-02-12 NOTE — Progress Notes (Signed)
Hematology and Oncology Follow Up Visit  Erik Gross [id number] 03-26-1955 58 y.o. 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 He has a 5.3 x 4.0 x 5.6 cm diameter mass at the left aspect of the urinary bladder. This was initially noted back in December of 2014    Prior Therapy:  He underwent a TURBT and postoperative mitomycin-C on 02/25/2013. Retrograde pyelograms were negative at that time. He underwent a repeat TURBT of a more than a 5 cm large tumor and clot evacuation on 12/16/2013. The pathology showed a high-grade invasive transitional cell carcinoma.  Current therapy: neoadjuvant systemic chemotherapy with cisplatin and gemcitabine. Cisplatin and gemcitabine given on day 1 with gemcitabine given on day 8. He started on 01/14/2014. Today is day 8 of cycle 2.  Interim History: Mr. Kunert presents today for a followup visit. Since the last visit, he received the last chemotherapy with cisplatin without any complications. He tolerated it better with dexamethasone for the next 3-4 days. His appetite have been excellent without any delayed nausea. He did not have any vomiting or peripheral neuropathy. He did not have any hearing deficits. He is feeling better over the last few days.  He is regaining most activities of daily living. He is not reporting any headaches or blurry vision or syncope. He does not report any fevers or chills or sweats or loss. He does not report any abdominal pain. He does not report any chest pain or palpitation or orthopnea. He did not report any wheezing or cough or hemoptysis.  He did not report any flank pain or pelvic pain. He does not report any lower extremity edema or change in his mentation. He does not report any skeletal complaints. He continues to be in excellent health and excellent performance status.  Remainder of his review of systems unremarkable.   Medications: I have reviewed the patient's current medications.  Current Outpatient Prescriptions  Medication Sig Dispense Refill  . acetaminophen (TYLENOL) 500 MG tablet Take 500 mg by mouth every 4 (four) hours as needed for mild pain or headache.     . bisacodyl (STIMULANT LAXATIVE) 5 MG EC tablet Take 10 mg by mouth daily as needed for mild constipation.     . calcium carbonate (TUMS - DOSED IN MG ELEMENTAL CALCIUM) 500 MG chewable tablet Chew 1 tablet by mouth daily.    Marland Kitchen dexamethasone (DECADRON) 4 MG tablet Take one tablet every 8 hours for 3 days after chemotherapy. 30 tablet 0  . docusate sodium (COLACE) 100 MG capsule Take 1 capsule (100 mg total) by mouth 2 (two) times daily as needed (take to keep stool soft.). 60 capsule 0  . Influenza Vac Split Quad (FLUZONE) 0.25 ML injection Inject 0.25 mLs into the muscle once.    . lidocaine-prilocaine (EMLA) cream Apply 1 application topically as needed. Apply to port on the day of chemotherapy. 30 g 0  . LORazepam (ATIVAN) 1 MG tablet Take 1 tablet (1 mg total) by mouth every 6 (six) hours as needed for anxiety. 30 tablet 0  . Multiple Vitamin (MULTIVITAMIN) tablet Take 1 tablet by mouth daily.    . ondansetron (ZOFRAN) 8 MG tablet Take 1 tablet (8 mg total) by mouth every 8 (eight) hours as needed for nausea or vomiting. 30 tablet 1  . oxyCODONE (ROXICODONE) 5 MG immediate release tablet Take 1 tablet (5  mg total) by mouth every 4 (four) hours as needed. 15 tablet 0  . polyethylene glycol (MIRALAX / GLYCOLAX) packet Take 17 g by mouth daily.    . prochlorperazine (COMPAZINE) 10 MG tablet Take 1 tablet (10 mg total) by mouth every 6 (six) hours as needed for nausea or vomiting. 30 tablet 1  . TOVIAZ 4 MG TB24 tablet Take 4 mg by mouth daily.  3  . TURMERIC PO Take 1 tablet by mouth daily.    . Wheat Dextrin (BENEFIBER DRINK MIX PO) Take 1 packet by mouth daily.     Marland Kitchen zolpidem (AMBIEN) 5 MG  tablet Take 1 tablet (5 mg total) by mouth at bedtime as needed for sleep. 30 tablet 0   No current facility-administered medications for this visit.     Allergies:  Allergies  Allergen Reactions  . Emend [Fosaprepitant] Shortness Of Breath and Other (See Comments)    Bright red face.    Past Medical History, Surgical history, Social history, and Family History were reviewed and updated.   Physical Exam: Blood pressure 128/82, pulse 84, temperature 97.6 F (36.4 C), temperature source Oral, resp. rate 18, height 5\' 6"  (1.676 m), weight 190 lb 4.8 oz (86.32 kg). ECOG: 0 General appearance: alert and cooperative Head: Normocephalic, without obvious abnormality Neck: no adenopathy Lymph nodes: Cervical, supraclavicular, and axillary nodes normal. Heart:regular rate and rhythm, S1, S2 normal, no murmur, click, rub or gallop Lung:chest clear, no wheezing, rales, normal symmetric air entry Abdomin: soft, non-tender, without masses or organomegaly EXT:no erythema, induration, or nodules   Lab Results: Lab Results  Component Value Date   WBC 8.3 02/12/2014   HGB 11.6* 02/12/2014   HCT 36.1* 02/12/2014   MCV 79.9 02/12/2014   PLT 277 02/12/2014     Chemistry      Component Value Date/Time   NA 133* 02/12/2014 0911   NA 136* 01/05/2014 1302   K 4.6 02/12/2014 0911   K 4.2 01/05/2014 1302   CL 99 01/05/2014 1302   CO2 26 02/12/2014 0911   CO2 24 01/05/2014 1302   BUN 16.7 02/12/2014 0911   BUN 11 01/05/2014 1302   CREATININE 0.8 02/12/2014 0911   CREATININE 0.84 01/05/2014 1302      Component Value Date/Time   CALCIUM 9.6 02/12/2014 0911   CALCIUM 9.2 01/05/2014 1302   ALKPHOS 64 02/12/2014 0911   ALKPHOS 46 12/15/2013 0550   AST 15 02/12/2014 0911   AST 10 12/15/2013 0550   ALT 24 02/12/2014 0911   ALT 7 12/15/2013 0550   BILITOT 0.28 02/12/2014 0911   BILITOT 0.4 12/15/2013 0550       Impression and Plan:  58 year old gentleman with the following issues:   1. 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 He has a 5.3 x 4.0 x 5.6 cm diameter mass at the left aspect of the urinary bladder.   He is currently receiving neoadjuvant systemic chemotherapy with cisplatin and Gemzar. He tolerated the last chemotherapy well without any complications. He is ready to proceed with day 8 of cycle 2. The plan is to proceed with total of 3 full cycles and repeat imaging studies in January 2016. This was scheduled today before his discharge.  2. Iron deficiency anemia: his hemoglobin is much improved.  3. IV access: Port-A-Cath in place without any complication.  4. Anti-emetics: He has Compazine and Zofran and we added dexamethasone for delayed nausea.  5. Followup:  Will be in 2 weeks for day 1 of cycle 3.     JOITGP,QDIYM, MD 11/27/20159:51 AM

## 2014-02-12 NOTE — Telephone Encounter (Signed)
Per staff message and POF I have scheduled appts. Advised scheduler of appts. JMW  

## 2014-02-12 NOTE — Patient Instructions (Signed)
[facility] Discharge Instructions for Patients Receiving Chemotherapy  Today you received the following chemotherapy agents Gemcitabine.   To help prevent nausea and vomiting after your treatment, we encourage you to take your nausea medication as directed.    If you develop nausea and vomiting that is not controlled by your nausea medication, call the clinic.   BELOW ARE SYMPTOMS THAT SHOULD BE REPORTED IMMEDIATELY:  *FEVER GREATER THAN 100.5 F  *CHILLS WITH OR WITHOUT FEVER  NAUSEA AND VOMITING THAT IS NOT CONTROLLED WITH YOUR NAUSEA MEDICATION  *UNUSUAL SHORTNESS OF BREATH  *UNUSUAL BRUISING OR BLEEDING  TENDERNESS IN MOUTH AND THROAT WITH OR WITHOUT PRESENCE OF ULCERS  *URINARY PROBLEMS  *BOWEL PROBLEMS  UNUSUAL RASH Items with * indicate a potential emergency and should be followed up as soon as possible.  Feel free to call the clinic you have any questions or concerns. The clinic phone number is [phone].    

## 2014-02-12 NOTE — Telephone Encounter (Signed)
Gave avs & cal for Dec/[place]ix[place]. Also gave pt contrast for CT scan. Sent mess to sch tx.

## 2014-02-26 ENCOUNTER — Ambulatory Visit (HOSPITAL_BASED_OUTPATIENT_CLINIC_OR_DEPARTMENT_OTHER): Payer: BC Managed Care – PPO

## 2014-02-26 ENCOUNTER — Other Ambulatory Visit (HOSPITAL_BASED_OUTPATIENT_CLINIC_OR_DEPARTMENT_OTHER): Payer: BC Managed Care – PPO

## 2014-02-26 ENCOUNTER — Ambulatory Visit (HOSPITAL_BASED_OUTPATIENT_CLINIC_OR_DEPARTMENT_OTHER): Payer: BC Managed Care – PPO | Admitting: Oncology

## 2014-02-26 ENCOUNTER — Encounter: Payer: Self-pay | Admitting: Oncology

## 2014-02-26 VITALS — BP 131/72 | HR 82 | Temp 98.2°F | Resp 18 | Ht 66.0 in | Wt 196.3 lb

## 2014-02-26 DIAGNOSIS — C679 Malignant neoplasm of bladder, unspecified: Secondary | ICD-10-CM

## 2014-02-26 DIAGNOSIS — D509 Iron deficiency anemia, unspecified: Secondary | ICD-10-CM

## 2014-02-26 DIAGNOSIS — Z5111 Encounter for antineoplastic chemotherapy: Secondary | ICD-10-CM

## 2014-02-26 LAB — CBC WITH DIFFERENTIAL/PLATELET
BASO%: 0.7 % (ref 0.0–2.0)
Basophils Absolute: 0 10*3/uL (ref 0.0–0.1)
EOS ABS: 0.1 10*3/uL (ref 0.0–0.5)
EOS%: 2.5 % (ref 0.0–7.0)
HCT: 36.4 % — ABNORMAL LOW (ref 38.4–49.9)
HGB: 11.5 g/dL — ABNORMAL LOW (ref 13.0–17.1)
LYMPH%: 25.2 % (ref 14.0–49.0)
MCH: 27 pg — ABNORMAL LOW (ref 27.2–33.4)
MCHC: 31.6 g/dL — AB (ref 32.0–36.0)
MCV: 85.4 fL (ref 79.3–98.0)
MONO#: 0.8 10*3/uL (ref 0.1–0.9)
MONO%: 15 % — ABNORMAL HIGH (ref 0.0–14.0)
NEUT%: 56.6 % (ref 39.0–75.0)
NEUTROS ABS: 3.1 10*3/uL (ref 1.5–6.5)
Platelets: 212 10*3/uL (ref 140–400)
RBC: 4.26 10*6/uL (ref 4.20–5.82)
RDW: 28.1 % — AB (ref 11.0–14.6)
WBC: 5.6 10*3/uL (ref 4.0–10.3)
lymph#: 1.4 10*3/uL (ref 0.9–3.3)

## 2014-02-26 LAB — COMPREHENSIVE METABOLIC PANEL (CC13)
ALBUMIN: 3.3 g/dL — AB (ref 3.5–5.0)
ALT: 18 U/L (ref 0–55)
ANION GAP: 7 meq/L (ref 3–11)
AST: 18 U/L (ref 5–34)
Alkaline Phosphatase: 64 U/L (ref 40–150)
BUN: 13.2 mg/dL (ref 7.0–26.0)
CO2: 26 meq/L (ref 22–29)
Calcium: 9.1 mg/dL (ref 8.4–10.4)
Chloride: 105 mEq/L (ref 98–109)
Creatinine: 1 mg/dL (ref 0.7–1.3)
EGFR: 84 mL/min/{1.73_m2} — ABNORMAL LOW (ref >90)
Glucose: 69 mg/dl — ABNORMAL LOW (ref 70–140)
POTASSIUM: 4.4 meq/L (ref 3.5–5.1)
SODIUM: 138 meq/L (ref 136–145)
TOTAL PROTEIN: 6.3 g/dL — AB (ref 6.4–8.3)
Total Bilirubin: 0.44 mg/dL (ref 0.20–1.20)

## 2014-02-26 MED ORDER — DEXAMETHASONE SODIUM PHOSPHATE 20 MG/5ML IJ SOLN
12.0000 mg | Freq: Once | INTRAMUSCULAR | Status: AC
  Administered 2014-02-26: 12 mg via INTRAVENOUS

## 2014-02-26 MED ORDER — SODIUM CHLORIDE 0.9 % IV SOLN
Freq: Once | INTRAVENOUS | Status: AC
  Administered 2014-02-26: 10:00:00 via INTRAVENOUS

## 2014-02-26 MED ORDER — DEXAMETHASONE SODIUM PHOSPHATE 20 MG/5ML IJ SOLN
INTRAMUSCULAR | Status: AC
  Filled 2014-02-26: qty 5

## 2014-02-26 MED ORDER — PALONOSETRON HCL INJECTION 0.25 MG/5ML
0.2500 mg | Freq: Once | INTRAVENOUS | Status: AC
  Administered 2014-02-26: 0.25 mg via INTRAVENOUS

## 2014-02-26 MED ORDER — SODIUM CHLORIDE 0.9 % IV SOLN
1000.0000 mg/m2 | Freq: Once | INTRAVENOUS | Status: AC
  Administered 2014-02-26: 2052 mg via INTRAVENOUS
  Filled 2014-02-26: qty 53.97

## 2014-02-26 MED ORDER — PALONOSETRON HCL INJECTION 0.25 MG/5ML
INTRAVENOUS | Status: AC
  Filled 2014-02-26: qty 5

## 2014-02-26 MED ORDER — POTASSIUM CHLORIDE 2 MEQ/ML IV SOLN
Freq: Once | INTRAVENOUS | Status: AC
  Administered 2014-02-26: 11:00:00 via INTRAVENOUS
  Filled 2014-02-26: qty 10

## 2014-02-26 MED ORDER — SODIUM CHLORIDE 0.9 % IV SOLN
75.0000 mg/m2 | Freq: Once | INTRAVENOUS | Status: AC
  Administered 2014-02-26: 153 mg via INTRAVENOUS
  Filled 2014-02-26: qty 153

## 2014-02-26 MED ORDER — SODIUM CHLORIDE 0.9 % IJ SOLN
10.0000 mL | INTRAMUSCULAR | Status: DC | PRN
  Administered 2014-02-26: 10 mL
  Filled 2014-02-26: qty 10

## 2014-02-26 MED ORDER — HEPARIN SOD (PORK) LOCK FLUSH 100 UNIT/ML IV SOLN
500.0000 [IU] | Freq: Once | INTRAVENOUS | Status: AC | PRN
  Administered 2014-02-26: 500 [IU]
  Filled 2014-02-26: qty 5

## 2014-02-26 NOTE — Patient Instructions (Signed)
[facility] Discharge Instructions for Patients Receiving Chemotherapy  Today you received the following chemotherapy agents gemzar and cisplatin  To help prevent nausea and vomiting after your treatment, we encourage you to take your nausea medication as needed   If you develop nausea and vomiting that is not controlled by your nausea medication, call the clinic.   BELOW ARE SYMPTOMS THAT SHOULD BE REPORTED IMMEDIATELY:  *FEVER GREATER THAN 100.5 F  *CHILLS WITH OR WITHOUT FEVER  NAUSEA AND VOMITING THAT IS NOT CONTROLLED WITH YOUR NAUSEA MEDICATION  *UNUSUAL SHORTNESS OF BREATH  *UNUSUAL BRUISING OR BLEEDING  TENDERNESS IN MOUTH AND THROAT WITH OR WITHOUT PRESENCE OF ULCERS  *URINARY PROBLEMS  *BOWEL PROBLEMS  UNUSUAL RASH Items with * indicate a potential emergency and should be followed up as soon as possible.  Feel free to call the clinic you have any questions or concerns. The clinic phone number is [phone].

## 2014-02-26 NOTE — Progress Notes (Signed)
1020 voided 400 cc before port accessed Voided 800 cc urine before cisplatin started Voided 525 cc urine since cisplatin completed

## 2014-02-26 NOTE — Progress Notes (Signed)
Hematology and Oncology Follow Up Visit  Erik Gross [id number] [birth year] 58 y.o. 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 He has a 5.3 x 4.0 x 5.6 cm diameter mass at the left aspect of the urinary bladder. This was initially noted back in December of 2014    Prior Therapy:  He underwent a TURBT and postoperative mitomycin-C on 02/25/2013. Retrograde pyelograms were negative at that time. He underwent a repeat TURBT of a more than a 5 cm large tumor and clot evacuation on 12/16/2013. The pathology showed a high-grade invasive transitional cell carcinoma.  Current therapy: neoadjuvant systemic chemotherapy with cisplatin and gemcitabine. Cisplatin and gemcitabine given on day 1 with gemcitabine given on day 8. He started on 01/14/2014. Today is day 1 of cycle 3.  Interim History: [name] presents today for a followup visit. Since the last visit, he received the last chemotherapy with gemzar only without any complications. He tolerated it better with dexamethasone for the next 3-4 days. His appetite have been excellent without any delayed nausea. He did not have any vomiting or peripheral neuropathy. He did not have any hearing deficits. He is feeling better over the last few days.  He is regaining most activities of daily living. He is not reporting any headaches or blurry vision or syncope. He does not report any fevers or chills or sweats or loss. He does not report any abdominal pain. He does not report any chest pain or palpitation or orthopnea. He did not report any wheezing or cough or hemoptysis.  He did not report any flank pain or pelvic pain. He does not report any lower extremity edema or change in his mentation. He does not report any skeletal complaints. He continues to be in excellent health and excellent performance status.  Remainder of his review of systems unremarkable.   Medications: I have reviewed the patient's current medications.  Current Outpatient Prescriptions  Medication Sig Dispense Refill  . acetaminophen (TYLENOL) 500 MG tablet Take 500 mg by mouth every 4 (four) hours as needed for mild pain or headache.     . calcium carbonate (TUMS - DOSED IN MG ELEMENTAL CALCIUM) 500 MG chewable tablet Chew 1 tablet by mouth daily.    Marland Kitchen dexamethasone (DECADRON) 4 MG tablet Take one tablet every 8 hours for 3 days after chemotherapy. 30 tablet 0  . Influenza Vac Split Quad (FLUZONE) 0.25 ML injection Inject 0.25 mLs into the muscle once.    . lidocaine-prilocaine (EMLA) cream Apply 1 application topically as needed. Apply to port on the day of chemotherapy. 30 g 0  . LORazepam (ATIVAN) 1 MG tablet Take 1 tablet (1 mg total) by mouth every 6 (six) hours as needed for anxiety. 30 tablet 0  . Multiple Vitamin (MULTIVITAMIN) tablet Take 1 tablet by mouth daily.    . polyethylene glycol (MIRALAX / GLYCOLAX) packet Take 17 g by mouth daily.    . TURMERIC PO Take 1 tablet by mouth daily.    . bisacodyl (STIMULANT LAXATIVE) 5 MG EC tablet Take 10 mg by mouth daily as needed for mild constipation.     . docusate sodium (COLACE) 100 MG capsule Take 1 capsule (100 mg total) by mouth 2 (two) times daily as needed (take to keep stool soft.). (Patient not taking: Reported on 02/26/2014) 60 capsule 0  . ondansetron (ZOFRAN) 8 MG  tablet Take 1 tablet (8 mg total) by mouth every 8 (eight) hours as needed for nausea or vomiting. (Patient not taking: Reported on 02/26/2014) 30 tablet 1  . oxyCODONE (ROXICODONE) 5 MG immediate release tablet Take 1 tablet (5 mg total) by mouth every 4 (four) hours as needed. (Patient not taking: Reported on 02/26/2014) 15 tablet 0  . prochlorperazine (COMPAZINE) 10 MG tablet Take 1 tablet (10 mg total) by mouth every 6 (six) hours as needed for nausea or vomiting. (Patient not taking: Reported on  02/26/2014) 30 tablet 1  . TOVIAZ 4 MG TB24 tablet Take 4 mg by mouth daily.  3  . Wheat Dextrin (BENEFIBER DRINK MIX PO) Take 1 packet by mouth daily.     Marland Kitchen zolpidem (AMBIEN) 5 MG tablet Take 1 tablet (5 mg total) by mouth at bedtime as needed for sleep. (Patient not taking: Reported on 02/26/2014) 30 tablet 0   No current facility-administered medications for this visit.   Facility-Administered Medications Ordered in Other Visits  Medication Dose Route Frequency Provider Last Rate Last Dose  . heparin lock flush 100 unit/mL  500 Units Intracatheter Once PRN Wyatt Portela, MD      . sodium chloride 0.9 % injection 10 mL  10 mL Intracatheter PRN Wyatt Portela, MD         Allergies:  Allergies  Allergen Reactions  . Emend [Fosaprepitant] Shortness Of Breath and Other (See Comments)    Bright red face.    Past Medical History, Surgical history, Social history, and Family History were reviewed and updated.   Physical Exam: Blood pressure 131/72, pulse 82, temperature 98.2 F (36.8 C), temperature source Oral, resp. rate 18, height 5\' 6"  (1.676 m), weight 196 lb 4.8 oz (89.041 kg), SpO2 100 %. ECOG: 0 General appearance: alert and cooperative Head: Normocephalic, without obvious abnormality Neck: no adenopathy Lymph nodes: Cervical, supraclavicular, and axillary nodes normal. Heart:regular rate and rhythm, S1, S2 normal, no murmur, click, rub or gallop Lung:chest clear, no wheezing, rales, normal symmetric air entry Abdomen: soft, non-tender, without masses or organomegaly EXT:no erythema, induration, or nodules   Lab Results: Lab Results  Component Value Date   WBC 5.6 02/26/2014   HGB 11.5* 02/26/2014   HCT 36.4* 02/26/2014   MCV 85.4 02/26/2014   PLT 212 02/26/2014     Chemistry      Component Value Date/Time   NA 138 02/26/2014 0856   NA 136* 01/05/2014 1302   K 4.4 02/26/2014 0856   K 4.2 01/05/2014 1302   CL 99 01/05/2014 1302   CO2 26 02/26/2014 0856   CO2  24 01/05/2014 1302   BUN 13.2 02/26/2014 0856   BUN 11 01/05/2014 1302   CREATININE 1.0 02/26/2014 0856   CREATININE 0.84 01/05/2014 1302      Component Value Date/Time   CALCIUM 9.1 02/26/2014 0856   CALCIUM 9.2 01/05/2014 1302   ALKPHOS 64 02/26/2014 0856   ALKPHOS 46 12/15/2013 0550   AST 18 02/26/2014 0856   AST 10 12/15/2013 0550   ALT 18 02/26/2014 0856   ALT 7 12/15/2013 0550   BILITOT 0.44 02/26/2014 0856   BILITOT 0.4 12/15/2013 0550       Impression and Plan:  58 year old gentleman with the following issues:  1. 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 He has a 5.3 x 4.0 x 5.6 cm diameter mass at the left aspect of the urinary bladder.  He is currently receiving neoadjuvant systemic chemotherapy with cisplatin and Gemzar. He tolerated the last chemotherapy well without any complications. He is ready to proceed with day 1 of cycle 3. The plan is to proceed with total of 3 full cycles and repeat imaging studies in January 2016. This has already been scheduled.  2. Iron deficiency anemia: his hemoglobin is much improved.  3. IV access: Port-A-Cath in place without any complication.  4. Anti-emetics: He has Compazine and Zofran and we added dexamethasone for delayed nausea.  5. Followup: Will be in 1 week for day 8 of cycle 3.     CURCIO, KRISTIN, DNP, AGPCNP-BC, AOCNP 12/11/201510:56 AM

## 2014-03-05 ENCOUNTER — Ambulatory Visit (HOSPITAL_BASED_OUTPATIENT_CLINIC_OR_DEPARTMENT_OTHER): Payer: BC Managed Care – PPO

## 2014-03-05 ENCOUNTER — Ambulatory Visit (HOSPITAL_BASED_OUTPATIENT_CLINIC_OR_DEPARTMENT_OTHER): Payer: BC Managed Care – PPO | Admitting: Physician Assistant

## 2014-03-05 ENCOUNTER — Other Ambulatory Visit (HOSPITAL_BASED_OUTPATIENT_CLINIC_OR_DEPARTMENT_OTHER): Payer: BC Managed Care – PPO

## 2014-03-05 ENCOUNTER — Encounter: Payer: Self-pay | Admitting: Physician Assistant

## 2014-03-05 VITALS — BP 136/83 | HR 83 | Temp 97.8°F | Resp 19 | Ht 66.0 in | Wt 195.3 lb

## 2014-03-05 DIAGNOSIS — C679 Malignant neoplasm of bladder, unspecified: Secondary | ICD-10-CM

## 2014-03-05 DIAGNOSIS — D509 Iron deficiency anemia, unspecified: Secondary | ICD-10-CM

## 2014-03-05 DIAGNOSIS — Z5111 Encounter for antineoplastic chemotherapy: Secondary | ICD-10-CM

## 2014-03-05 DIAGNOSIS — C672 Malignant neoplasm of lateral wall of bladder: Secondary | ICD-10-CM

## 2014-03-05 LAB — CBC WITH DIFFERENTIAL/PLATELET
BASO%: 0.7 % (ref 0.0–2.0)
Basophils Absolute: 0 10*3/uL (ref 0.0–0.1)
EOS%: 0.7 % (ref 0.0–7.0)
Eosinophils Absolute: 0 10*3/uL (ref 0.0–0.5)
HEMATOCRIT: 34.1 % — AB (ref 38.4–49.9)
HEMOGLOBIN: 11 g/dL — AB (ref 13.0–17.1)
LYMPH#: 2 10*3/uL (ref 0.9–3.3)
LYMPH%: 33.8 % (ref 14.0–49.0)
MCH: 27.6 pg (ref 27.2–33.4)
MCHC: 32.3 g/dL (ref 32.0–36.0)
MCV: 85.7 fL (ref 79.3–98.0)
MONO#: 0.4 10*3/uL (ref 0.1–0.9)
MONO%: 6.2 % (ref 0.0–14.0)
NEUT#: 3.4 10*3/uL (ref 1.5–6.5)
NEUT%: 58.6 % (ref 39.0–75.0)
Platelets: 255 10*3/uL (ref 140–400)
RBC: 3.98 10*6/uL — ABNORMAL LOW (ref 4.20–5.82)
RDW: 26.1 % — AB (ref 11.0–14.6)
WBC: 5.8 10*3/uL (ref 4.0–10.3)

## 2014-03-05 LAB — COMPREHENSIVE METABOLIC PANEL (CC13)
ALK PHOS: 60 U/L (ref 40–150)
ALT: 37 U/L (ref 0–55)
AST: 26 U/L (ref 5–34)
Albumin: 3.5 g/dL (ref 3.5–5.0)
Anion Gap: 10 mEq/L (ref 3–11)
BUN: 18.8 mg/dL (ref 7.0–26.0)
CO2: 27 mEq/L (ref 22–29)
Calcium: 9.5 mg/dL (ref 8.4–10.4)
Chloride: 101 mEq/L (ref 98–109)
Creatinine: 1 mg/dL (ref 0.7–1.3)
EGFR: 81 mL/min/{1.73_m2} — ABNORMAL LOW (ref >90)
Glucose: 68 mg/dl — ABNORMAL LOW (ref 70–140)
Potassium: 4.1 mEq/L (ref 3.5–5.1)
SODIUM: 138 meq/L (ref 136–145)
TOTAL PROTEIN: 6.4 g/dL (ref 6.4–8.3)
Total Bilirubin: 0.33 mg/dL (ref 0.20–1.20)

## 2014-03-05 LAB — TECHNOLOGIST REVIEW

## 2014-03-05 MED ORDER — HEPARIN SOD (PORK) LOCK FLUSH 100 UNIT/ML IV SOLN
500.0000 [IU] | Freq: Once | INTRAVENOUS | Status: AC | PRN
  Administered 2014-03-05: 500 [IU]
  Filled 2014-03-05: qty 5

## 2014-03-05 MED ORDER — SODIUM CHLORIDE 0.9 % IV SOLN
1000.0000 mg/m2 | Freq: Once | INTRAVENOUS | Status: AC
  Administered 2014-03-05: 2052 mg via INTRAVENOUS
  Filled 2014-03-05: qty 53.97

## 2014-03-05 MED ORDER — PROCHLORPERAZINE MALEATE 10 MG PO TABS
ORAL_TABLET | ORAL | Status: AC
  Filled 2014-03-05: qty 1

## 2014-03-05 MED ORDER — SODIUM CHLORIDE 0.9 % IV SOLN
Freq: Once | INTRAVENOUS | Status: AC
  Administered 2014-03-05: 10:00:00 via INTRAVENOUS

## 2014-03-05 MED ORDER — PROCHLORPERAZINE MALEATE 10 MG PO TABS
10.0000 mg | ORAL_TABLET | Freq: Once | ORAL | Status: AC
  Administered 2014-03-05: 10 mg via ORAL

## 2014-03-05 MED ORDER — SODIUM CHLORIDE 0.9 % IJ SOLN
10.0000 mL | INTRAMUSCULAR | Status: DC | PRN
  Administered 2014-03-05: 10 mL
  Filled 2014-03-05: qty 10

## 2014-03-05 NOTE — Progress Notes (Signed)
Hematology and Oncology Follow Up Visit  AXELL TRIGUEROS [id number] 1955-05-23 58 y.o. 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 He has a 5.3 x 4.0 x 5.6 cm diameter mass at the left aspect of the urinary bladder. This was initially noted back in December of 2014    Prior Therapy:  He underwent a TURBT and postoperative mitomycin-C on 02/25/2013. Retrograde pyelograms were negative at that time. He underwent a repeat TURBT of a more than a 5 cm large tumor and clot evacuation on 12/16/2013. The pathology showed a high-grade invasive transitional cell carcinoma.  Current therapy: neoadjuvant systemic chemotherapy with cisplatin and gemcitabine. Cisplatin and gemcitabine given on day 1 with gemcitabine given on day 8. He started on 01/14/2014. Today is day 8 of cycle 3.  Interim History: Mr. Markell presents today for a followup visit accompanied by his wife. Since the last visit, he received the last chemotherapy with gemzar only without any complications. He tolerated it better with dexamethasone for the next 3-4 days. His appetite have been excellent without any delayed nausea. He did not have any vomiting or peripheral neuropathy. He did not have any hearing deficits. Today he voices no specific complaints. He is able to perform his activities of daily living without difficulty. He is not reporting any headaches or blurry vision or syncope. He does not report any fevers or chills or sweats or loss. He does not report any abdominal pain. He does not report any chest pain or palpitation or orthopnea. He did not report any wheezing or cough or hemoptysis.  He did not report any flank pain or pelvic pain. He does not report any lower extremity edema or change in his mentation. He does not report any skeletal complaints. He continues to be in excellent  health and excellent performance status. Remainder of his review of systems unremarkable.   Medications: I have reviewed the patient's current medications.  Current Outpatient Prescriptions  Medication Sig Dispense Refill  . acetaminophen (TYLENOL) 500 MG tablet Take 500 mg by mouth every 4 (four) hours as needed for mild pain or headache.     . calcium carbonate (TUMS - DOSED IN MG ELEMENTAL CALCIUM) 500 MG chewable tablet Chew 1 tablet by mouth daily.    Marland Kitchen dexamethasone (DECADRON) 4 MG tablet Take one tablet every 8 hours for 3 days after chemotherapy. 30 tablet 0  . lidocaine-prilocaine (EMLA) cream Apply 1 application topically as needed. Apply to port on the day of chemotherapy. 30 g 0  . LORazepam (ATIVAN) 1 MG tablet Take 1 tablet (1 mg total) by mouth every 6 (six) hours as needed for anxiety. 30 tablet 0  . Multiple Vitamin (MULTIVITAMIN) tablet Take 1 tablet by mouth daily.    . TURMERIC PO Take 1 tablet by mouth daily.    . bisacodyl (STIMULANT LAXATIVE) 5 MG EC tablet Take 10 mg by mouth daily as needed for mild constipation.     . docusate sodium (COLACE) 100 MG capsule Take 1 capsule (100 mg total) by mouth 2 (two) times daily as needed (take to keep stool soft.). (Patient not taking: Reported on 03/05/2014) 60 capsule 0  . Influenza Vac Split Quad (FLUZONE) 0.25 ML injection Inject 0.25 mLs into the muscle once.    . ondansetron (ZOFRAN) 8 MG tablet Take 1 tablet (8 mg total) by mouth every 8 (eight)  hours as needed for nausea or vomiting. (Patient not taking: Reported on 02/26/2014) 30 tablet 1  . oxyCODONE (ROXICODONE) 5 MG immediate release tablet Take 1 tablet (5 mg total) by mouth every 4 (four) hours as needed. (Patient not taking: Reported on 02/26/2014) 15 tablet 0  . polyethylene glycol (MIRALAX / GLYCOLAX) packet Take 17 g by mouth daily.    . prochlorperazine (COMPAZINE) 10 MG tablet Take 1 tablet (10 mg total) by mouth every 6 (six) hours as needed for nausea or vomiting.  (Patient not taking: Reported on 02/26/2014) 30 tablet 1  . TOVIAZ 4 MG TB24 tablet Take 4 mg by mouth daily.  3  . Wheat Dextrin (BENEFIBER DRINK MIX PO) Take 1 packet by mouth daily.     Marland Kitchen zolpidem (AMBIEN) 5 MG tablet Take 1 tablet (5 mg total) by mouth at bedtime as needed for sleep. (Patient not taking: Reported on 02/26/2014) 30 tablet 0   No current facility-administered medications for this visit.     Allergies:  Allergies  Allergen Reactions  . Emend [Fosaprepitant] Shortness Of Breath and Other (See Comments)    Bright red face.    Past Medical History, Surgical history, Social history, and Family History were reviewed and updated.   Physical Exam: Blood pressure 136/83, pulse 83, temperature 97.8 F (36.6 C), temperature source Oral, resp. rate 19, height 5\' 6"  (1.676 m), weight 195 lb 4.8 oz (88.587 kg), SpO2 100 %. ECOG: 0 General appearance: alert and cooperative Head: Normocephalic, without obvious abnormality Neck: no adenopathy Lymph nodes: Cervical, supraclavicular, and axillary nodes normal. Heart:regular rate and rhythm, S1, S2 normal, no murmur, click, rub or gallop Lung:chest clear, no wheezing, rales, normal symmetric air entry Abdomen: soft, non-tender, without masses or organomegaly EXT:no erythema, induration, or nodules   Lab Results: Lab Results  Component Value Date   WBC 5.8 03/05/2014   HGB 11.0* 03/05/2014   HCT 34.1* 03/05/2014   MCV 85.7 03/05/2014   PLT 255 03/05/2014     Chemistry      Component Value Date/Time   NA 138 03/05/2014 0915   NA 136* 01/05/2014 1302   K 4.1 03/05/2014 0915   K 4.2 01/05/2014 1302   CL 99 01/05/2014 1302   CO2 27 03/05/2014 0915   CO2 24 01/05/2014 1302   BUN 18.8 03/05/2014 0915   BUN 11 01/05/2014 1302   CREATININE 1.0 03/05/2014 0915   CREATININE 0.84 01/05/2014 1302      Component Value Date/Time   CALCIUM 9.5 03/05/2014 0915   CALCIUM 9.2 01/05/2014 1302   ALKPHOS 60 03/05/2014 0915    ALKPHOS 46 12/15/2013 0550   AST 26 03/05/2014 0915   AST 10 12/15/2013 0550   ALT 37 03/05/2014 0915   ALT 7 12/15/2013 0550   BILITOT 0.33 03/05/2014 0915   BILITOT 0.4 12/15/2013 0550       Impression and Plan:  58 year old gentleman with the following issues:  1. 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 He has a 5.3 x 4.0 x 5.6 cm diameter mass at the left aspect of the urinary bladder.   He is currently receiving neoadjuvant systemic chemotherapy with cisplatin and Gemzar. He tolerated the last chemotherapy well without any complications. He is ready to proceed with day 8 of cycle 3. The plan is to proceed with total of 3 full cycles and repeat imaging studies in January 2016. This has already been scheduled.  2.  Iron deficiency anemia: his hemoglobin is much improved.  3. IV access: Port-A-Cath in place without any complication.  4. Anti-emetics: He has Compazine and Zofran and we added dexamethasone for delayed nausea.  5. Followup: Will be in 3 weeks for reevaluation and to discuss the results of the restaging CT scan     Georgianna Band E, PA-C  12/18/20152:57 PM

## 2014-03-05 NOTE — Patient Instructions (Signed)
Continue labs and chemotherapy as scheduled Keep scheduled appointment for your restaging CT scan Follow up with Dr. Alen Blew as scheduled in 3 weeks

## 2014-03-05 NOTE — Patient Instructions (Signed)
[facility] Discharge Instructions for Patients Receiving Chemotherapy  Today you received the following chemotherapy agents Gemzar.  To help prevent nausea and vomiting after your treatment, we encourage you to take your nausea medication.   If you develop nausea and vomiting that is not controlled by your nausea medication, call the clinic.   BELOW ARE SYMPTOMS THAT SHOULD BE REPORTED IMMEDIATELY:  *FEVER GREATER THAN 100.5 F  *CHILLS WITH OR WITHOUT FEVER  NAUSEA AND VOMITING THAT IS NOT CONTROLLED WITH YOUR NAUSEA MEDICATION  *UNUSUAL SHORTNESS OF BREATH  *UNUSUAL BRUISING OR BLEEDING  TENDERNESS IN MOUTH AND THROAT WITH OR WITHOUT PRESENCE OF ULCERS  *URINARY PROBLEMS  *BOWEL PROBLEMS  UNUSUAL RASH Items with * indicate a potential emergency and should be followed up as soon as possible.  Feel free to call the clinic you have any questions or concerns. The clinic phone number is [phone].    

## 2014-03-22 ENCOUNTER — Other Ambulatory Visit (HOSPITAL_BASED_OUTPATIENT_CLINIC_OR_DEPARTMENT_OTHER): Payer: BLUE CROSS/BLUE SHIELD

## 2014-03-22 ENCOUNTER — Ambulatory Visit (HOSPITAL_COMMUNITY)
Admission: RE | Admit: 2014-03-22 | Discharge: 2014-03-22 | Disposition: A | Discharge status: Home / Self Care | Payer: BLUE CROSS/BLUE SHIELD | Source: Ambulatory Visit | Attending: Oncology | Admitting: Oncology

## 2014-03-22 DIAGNOSIS — K429 Umbilical hernia without obstruction or gangrene: Secondary | ICD-10-CM | POA: Insufficient documentation

## 2014-03-22 DIAGNOSIS — C679 Malignant neoplasm of bladder, unspecified: Secondary | ICD-10-CM

## 2014-03-22 DIAGNOSIS — I700 Atherosclerosis of aorta: Secondary | ICD-10-CM | POA: Insufficient documentation

## 2014-03-22 DIAGNOSIS — K7689 Other specified diseases of liver: Secondary | ICD-10-CM | POA: Diagnosis not present

## 2014-03-22 DIAGNOSIS — D509 Iron deficiency anemia, unspecified: Secondary | ICD-10-CM

## 2014-03-22 DIAGNOSIS — M4186 Other forms of scoliosis, lumbar region: Secondary | ICD-10-CM | POA: Insufficient documentation

## 2014-03-22 DIAGNOSIS — C672 Malignant neoplasm of lateral wall of bladder: Secondary | ICD-10-CM

## 2014-03-22 DIAGNOSIS — D7389 Other diseases of spleen: Secondary | ICD-10-CM | POA: Diagnosis not present

## 2014-03-22 LAB — CBC WITH DIFFERENTIAL/PLATELET
BASO%: 1 % (ref 0.0–2.0)
Basophils Absolute: 0.1 10*3/uL (ref 0.0–0.1)
EOS ABS: 0.1 10*3/uL (ref 0.0–0.5)
EOS%: 0.8 % (ref 0.0–7.0)
HCT: 35.9 % — ABNORMAL LOW (ref 38.4–49.9)
HGB: 11.7 g/dL — ABNORMAL LOW (ref 13.0–17.1)
LYMPH%: 31.9 % (ref 14.0–49.0)
MCH: 29.2 pg (ref 27.2–33.4)
MCHC: 32.6 g/dL (ref 32.0–36.0)
MCV: 89.5 fL (ref 79.3–98.0)
MONO#: 1 10*3/uL — AB (ref 0.1–0.9)
MONO%: 15.3 % — ABNORMAL HIGH (ref 0.0–14.0)
NEUT#: 3.2 10*3/uL (ref 1.5–6.5)
NEUT%: 51 % (ref 39.0–75.0)
Platelets: 285 10*3/uL (ref 140–400)
RBC: 4.01 10*6/uL — AB (ref 4.20–5.82)
RDW: 26.2 % — AB (ref 11.0–14.6)
WBC: 6.2 10*3/uL (ref 4.0–10.3)
lymph#: 2 10*3/uL (ref 0.9–3.3)

## 2014-03-22 LAB — COMPREHENSIVE METABOLIC PANEL (CC13)
ALBUMIN: 3.9 g/dL (ref 3.5–5.0)
ALK PHOS: 64 U/L (ref 40–150)
ALT: 18 U/L (ref 0–55)
AST: 20 U/L (ref 5–34)
Anion Gap: 9 mEq/L (ref 3–11)
BUN: 14.7 mg/dL (ref 7.0–26.0)
CALCIUM: 9.9 mg/dL (ref 8.4–10.4)
CHLORIDE: 101 meq/L (ref 98–109)
CO2: 28 mEq/L (ref 22–29)
Creatinine: 1 mg/dL (ref 0.7–1.3)
EGFR: 80 mL/min/{1.73_m2} — AB (ref >90)
GLUCOSE: 88 mg/dL (ref 70–140)
POTASSIUM: 4.8 meq/L (ref 3.5–5.1)
Sodium: 137 mEq/L (ref 136–145)
Total Bilirubin: 0.46 mg/dL (ref 0.20–1.20)
Total Protein: 6.9 g/dL (ref 6.4–8.3)

## 2014-03-22 LAB — TECHNOLOGIST REVIEW

## 2014-03-22 MED ORDER — IOHEXOL 300 MG/ML  SOLN
100.0000 mL | Freq: Once | INTRAMUSCULAR | Status: AC | PRN
  Administered 2014-03-22: 100 mL via INTRAVENOUS

## 2014-03-24 ENCOUNTER — Ambulatory Visit (HOSPITAL_BASED_OUTPATIENT_CLINIC_OR_DEPARTMENT_OTHER): Payer: BLUE CROSS/BLUE SHIELD | Admitting: Oncology

## 2014-03-24 ENCOUNTER — Telehealth: Payer: Self-pay | Admitting: Oncology

## 2014-03-24 VITALS — BP 125/73 | HR 90 | Temp 97.6°F | Resp 18 | Ht 66.0 in | Wt 204.7 lb

## 2014-03-24 DIAGNOSIS — C670 Malignant neoplasm of trigone of bladder: Secondary | ICD-10-CM

## 2014-03-24 DIAGNOSIS — C679 Malignant neoplasm of bladder, unspecified: Secondary | ICD-10-CM

## 2014-03-24 DIAGNOSIS — D509 Iron deficiency anemia, unspecified: Secondary | ICD-10-CM

## 2014-03-24 NOTE — Telephone Encounter (Signed)
Lft msg for pt confirming labs/flush/ov per 01/06 POF, gave pt AVS.... KJ

## 2014-03-24 NOTE — Progress Notes (Signed)
Hematology and Oncology Follow Up Visit  Erik Gross [id number] November 18, 1955 59 y.o. 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 He has a 5.3 x 4.0 x 5.6 cm diameter mass at the left aspect of the urinary bladder. This was initially noted back in December of 2014    Prior Therapy:  He underwent a TURBT and postoperative mitomycin-C on 02/25/2013. Retrograde pyelograms were negative at that time. He underwent a repeat TURBT of a more than a 5 cm large tumor and clot evacuation on 12/16/2013. The pathology showed a high-grade invasive transitional cell carcinoma.  Current therapy: neoadjuvant systemic chemotherapy with cisplatin and gemcitabine. Cisplatin and gemcitabine given on day 1 with gemcitabine given on day 8. He completed 3 cycles of chemotherapy in December 2015.   Interim History: Erik Gross presents today for a followup visit accompanied by his wife. Since the last visit, he completed chemotherapy without any delayed complications. He did report some occasional tremors and neuropathy that have resolved now.. His appetite have been excellent without any delayed nausea. He did not have any vomiting. He did not have any hearing deficits. Today he voices no specific complaints. He is able to perform his activities of daily living without difficulty. He is not reporting any headaches or blurry vision or syncope. He does not report any fevers or chills or sweats or loss. He does not report any abdominal pain. He does not report any chest pain or palpitation or orthopnea. He did not report any wheezing or cough or hemoptysis.  He did not report any flank pain or pelvic pain. He does not report any lower extremity edema or change in his mentation. He does not report any skeletal complaints. He continues to be in excellent health and excellent  performance status. Remainder of his review of systems unremarkable.   Medications: I have reviewed the patient's current medications.  Current Outpatient Prescriptions  Medication Sig Dispense Refill  . acetaminophen (TYLENOL) 500 MG tablet Take 500 mg by mouth every 4 (four) hours as needed for mild pain or headache.     . bisacodyl (STIMULANT LAXATIVE) 5 MG EC tablet Take 10 mg by mouth daily as needed for mild constipation.     . calcium carbonate (TUMS - DOSED IN MG ELEMENTAL CALCIUM) 500 MG chewable tablet Chew 1 tablet by mouth daily.    Marland Kitchen dexamethasone (DECADRON) 4 MG tablet Take one tablet every 8 hours for 3 days after chemotherapy. 30 tablet 0  . docusate sodium (COLACE) 100 MG capsule Take 1 capsule (100 mg total) by mouth 2 (two) times daily as needed (take to keep stool soft.). (Patient not taking: Reported on 03/05/2014) 60 capsule 0  . Influenza Vac Split Quad (FLUZONE) 0.25 ML injection Inject 0.25 mLs into the muscle once.    . lidocaine-prilocaine (EMLA) cream Apply 1 application topically as needed. Apply to port on the day of chemotherapy. 30 g 0  . LORazepam (ATIVAN) 1 MG tablet Take 1 tablet (1 mg total) by mouth every 6 (six) hours as needed for anxiety. 30 tablet 0  . Multiple Vitamin (MULTIVITAMIN) tablet Take 1 tablet by mouth daily.    . ondansetron (ZOFRAN) 8 MG tablet Take 1 tablet (8 mg total) by mouth every 8 (eight) hours as needed for nausea or vomiting. (Patient not taking: Reported on 02/26/2014) 30 tablet 1  . oxyCODONE (  ROXICODONE) 5 MG immediate release tablet Take 1 tablet (5 mg total) by mouth every 4 (four) hours as needed. (Patient not taking: Reported on 02/26/2014) 15 tablet 0  . polyethylene glycol (MIRALAX / GLYCOLAX) packet Take 17 g by mouth daily.    . prochlorperazine (COMPAZINE) 10 MG tablet Take 1 tablet (10 mg total) by mouth every 6 (six) hours as needed for nausea or vomiting. (Patient not taking: Reported on 02/26/2014) 30 tablet 1  . TOVIAZ  4 MG TB24 tablet Take 4 mg by mouth daily.  3  . TURMERIC PO Take 1 tablet by mouth daily.    . Wheat Dextrin (BENEFIBER DRINK MIX PO) Take 1 packet by mouth daily.     Marland Kitchen zolpidem (AMBIEN) 5 MG tablet Take 1 tablet (5 mg total) by mouth at bedtime as needed for sleep. (Patient not taking: Reported on 02/26/2014) 30 tablet 0   No current facility-administered medications for this visit.     Allergies:  Allergies  Allergen Reactions  . Emend [Fosaprepitant] Shortness Of Breath and Other (See Comments)    Bright red face.    Past Medical History, Surgical history, Social history, and Family History were reviewed and updated.   Physical Exam: Blood pressure 125/73, pulse 90, temperature 97.6 F (36.4 C), temperature source Oral, resp. rate 18, height 5\' 6"  (1.676 m), weight 204 lb 11.2 oz (92.851 kg), SpO2 100 %. ECOG: 0 General appearance: alert and cooperative Head: Normocephalic, without obvious abnormality Neck: no adenopathy Lymph nodes: Cervical, supraclavicular, and axillary nodes normal. Heart:regular rate and rhythm, S1, S2 normal, no murmur, click, rub or gallop Lung:chest clear, no wheezing, rales, normal symmetric air entry Abdomen: soft, non-tender, without masses or organomegaly EXT:no erythema, induration, or nodules   Lab Results: Lab Results  Component Value Date   WBC 6.2 03/22/2014   HGB 11.7* 03/22/2014   HCT 35.9* 03/22/2014   MCV 89.5 03/22/2014   PLT 285 03/22/2014     Chemistry      Component Value Date/Time   NA 137 03/22/2014 1159   NA 136* 01/05/2014 1302   K 4.8 03/22/2014 1159   K 4.2 01/05/2014 1302   CL 99 01/05/2014 1302   CO2 28 03/22/2014 1159   CO2 24 01/05/2014 1302   BUN 14.7 03/22/2014 1159   BUN 11 01/05/2014 1302   CREATININE 1.0 03/22/2014 1159   CREATININE 0.84 01/05/2014 1302      Component Value Date/Time   CALCIUM 9.9 03/22/2014 1159   CALCIUM 9.2 01/05/2014 1302   ALKPHOS 64 03/22/2014 1159   ALKPHOS 46 12/15/2013  0550   AST 20 03/22/2014 1159   AST 10 12/15/2013 0550   ALT 18 03/22/2014 1159   ALT 7 12/15/2013 0550   BILITOT 0.46 03/22/2014 1159   BILITOT 0.4 12/15/2013 0550     EXAM: CT CHEST, ABDOMEN, AND PELVIS WITH CONTRAST  TECHNIQUE: Multidetector CT imaging of the chest, abdomen and pelvis was performed following the standard protocol during bolus administration of intravenous contrast.  CONTRAST: 162mL OMNIPAQUE IOHEXOL 300 MG/ML SOLN  COMPARISON: CT chest from 03/09/2013 and CT abdomen pelvis from 12/14/13  FINDINGS: CT CHEST FINDINGS  Mediastinum: The heart size appears within normal limits. There is no pericardial effusion identified. No mediastinal or hilar adenopathy identified. Calcified atherosclerotic plaque involves the thoracic aorta.  Lungs/Pleura: No pleural effusion identified. There is no airspace consolidation identified. No suspicious pulmonary nodule or mass.  Musculoskeletal: There is no aggressive lytic or sclerotic bone lesions identified.  CT ABDOMEN AND PELVIS FINDINGS  Hepatobiliary: The multifocal low-attenuation foci within the liver are identified and appear similar to previous exam most likely representing simple cysts. The appearance is stable. No suspicious liver abnormality. The gallbladder is normal. No biliary dilatation.  Pancreas: Normal appearance of the pancreas.  Spleen: Calcified splenic granulomas identified.  Adrenals/Urinary Tract: The adrenal glands both appear normal. Unremarkable appearance of the kidneys. Left bladder wall has a maximum thickness of 0.9 cm, image 76/ series 2. This is compared with 3.3 cm previously. Soft tissue nodule at the bladder base is no longer visualized.  Stomach/Bowel: The stomach is normal. The small bowel loops have a normal course and caliber without evidence for bowel obstruction. The appendix is visualized and appears normal. Extensive diverticular change involves the  sigmoid colon.  Vascular/Lymphatic: Atherosclerotic disease involves the abdominal aorta. No aneurysm. No retroperitoneal or mesenteric adenopathy identified. There is no pelvic or inguinal adenopathy noted.  Reproductive: The prostate gland is unremarkable.  Other: No free fluid or fluid collections identified. There is a periumbilical hernia which contains fat only.  Musculoskeletal: Mild scoliosis deformity involves the lumbar spine which is convex towards the left. No aggressive lytic or sclerotic bone lesions identified.  IMPRESSION:  1. Significant decrease in size of enhancing mass involving the left lateral aspect of the urinary bladder. 2. No new findings and no evidence for metastatic disease. 3. Similar appearance of liver cysts. 4. Atherosclerotic disease   Impression and Plan:  59 year old gentleman with the following issues:  1. 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 He has a 5.3 x 4.0 x 5.6 cm diameter mass at the left aspect of the urinary bladder.   He is S/P neoadjuvant systemic chemotherapy with cisplatin and Gemzar. He tolerated the last chemotherapy well without any complications completing 3 cycles in December 2015. His CT scan images on 03/22/2014 shows significant improvement in the bladder mass without any disease outside of the bladder. I discussed these findings with the patient and his family today and also discussed with him the treatment options. I feel that his best curative option is to proceed with radical cystectomy and lymph node dissection to ensure disease control. I do not think chemotherapy alone is curative and I do not think chemoirradiation is a equivalent option in his case. I urged him to consider surgical resection very seriously as the sole curative option.  I will refer him back to Dr. Louis Meckel for an evaluation of a cystectomy in the near future.  2. Iron deficiency  anemia: his hemoglobin is much improved.  3. IV access: Port-A-Cath in place without any complication. This will be flushed in 6 weeks.  4. Anti-emetics: He has Compazine and Zofran and we added dexamethasone for delayed nausea. This has been no longer an issue since completion chemotherapy.  5. Followup: Will be in 4-5 weeks for a follow-up and a Port-A-Cath flush.    [facility], MD 1/6/20169:09 AM

## 2014-03-24 NOTE — Addendum Note (Signed)
Addended by: Randolm Idol on: 03/24/2014 09:33 AM   Modules accepted: Medications

## 2014-04-14 ENCOUNTER — Ambulatory Visit: Payer: Self-pay | Admitting: Internal Medicine

## 2014-04-14 DIAGNOSIS — D496 Neoplasm of unspecified behavior of brain: Secondary | ICD-10-CM

## 2014-04-14 HISTORY — DX: Neoplasm of unspecified behavior of brain: D49.6

## 2014-04-15 ENCOUNTER — Encounter (HOSPITAL_COMMUNITY): Payer: Self-pay | Admitting: Emergency Medicine

## 2014-04-15 ENCOUNTER — Other Ambulatory Visit: Payer: Self-pay | Admitting: Neurological Surgery

## 2014-04-15 ENCOUNTER — Ambulatory Visit
Admit: 2014-04-15 | Discharge: 2014-04-15 | Disposition: A | Discharge status: Home / Self Care | Payer: BLUE CROSS/BLUE SHIELD | Attending: Radiation Oncology | Admitting: Radiation Oncology

## 2014-04-15 ENCOUNTER — Encounter: Payer: Self-pay | Admitting: *Deleted

## 2014-04-15 ENCOUNTER — Inpatient Hospital Stay (HOSPITAL_COMMUNITY)
Admission: EM | Admit: 2014-04-15 | Discharge: 2014-04-16 | DRG: 055 | Disposition: A | Discharge status: Home / Self Care | Payer: BLUE CROSS/BLUE SHIELD | Attending: Internal Medicine | Admitting: Internal Medicine

## 2014-04-15 DIAGNOSIS — C7931 Secondary malignant neoplasm of brain: Principal | ICD-10-CM

## 2014-04-15 DIAGNOSIS — C672 Malignant neoplasm of lateral wall of bladder: Secondary | ICD-10-CM | POA: Diagnosis present

## 2014-04-15 DIAGNOSIS — M1990 Unspecified osteoarthritis, unspecified site: Secondary | ICD-10-CM | POA: Diagnosis present

## 2014-04-15 DIAGNOSIS — C679 Malignant neoplasm of bladder, unspecified: Secondary | ICD-10-CM | POA: Insufficient documentation

## 2014-04-15 DIAGNOSIS — R739 Hyperglycemia, unspecified: Secondary | ICD-10-CM | POA: Diagnosis present

## 2014-04-15 DIAGNOSIS — Z888 Allergy status to other drugs, medicaments and biological substances status: Secondary | ICD-10-CM | POA: Diagnosis not present

## 2014-04-15 DIAGNOSIS — D496 Neoplasm of unspecified behavior of brain: Secondary | ICD-10-CM

## 2014-04-15 DIAGNOSIS — R51 Headache: Secondary | ICD-10-CM | POA: Diagnosis not present

## 2014-04-15 DIAGNOSIS — R519 Headache, unspecified: Secondary | ICD-10-CM | POA: Diagnosis present

## 2014-04-15 DIAGNOSIS — Z87891 Personal history of nicotine dependence: Secondary | ICD-10-CM | POA: Diagnosis not present

## 2014-04-15 LAB — COMPREHENSIVE METABOLIC PANEL
ALK PHOS: 47 U/L (ref 39–117)
ALT: 11 U/L (ref 0–53)
AST: 20 U/L (ref 0–37)
Albumin: 3.6 g/dL (ref 3.5–5.2)
Anion gap: 11 (ref 5–15)
BILIRUBIN TOTAL: 0.7 mg/dL (ref 0.3–1.2)
BUN: 12 mg/dL (ref 6–23)
CO2: 22 mmol/L (ref 19–32)
Calcium: 9.2 mg/dL (ref 8.4–10.5)
Chloride: 105 mmol/L (ref 96–112)
Creatinine, Ser: 1.04 mg/dL (ref 0.50–1.35)
GFR, EST AFRICAN AMERICAN: 88 mL/min — AB (ref >90)
GFR, EST NON AFRICAN AMERICAN: 76 mL/min — AB (ref >90)
Glucose, Bld: 163 mg/dL — ABNORMAL HIGH (ref 70–99)
Potassium: 4.1 mmol/L (ref 3.5–5.1)
SODIUM: 138 mmol/L (ref 135–145)
TOTAL PROTEIN: 6.4 g/dL (ref 6.0–8.3)

## 2014-04-15 LAB — CBC WITH DIFFERENTIAL/PLATELET
BASOS PCT: 1 % (ref 0–1)
Basophils Absolute: 0 10*3/uL (ref 0.0–0.1)
Eosinophils Absolute: 0 10*3/uL (ref 0.0–0.7)
Eosinophils Relative: 0 % (ref 0–5)
HEMATOCRIT: 37.7 % — AB (ref 39.0–52.0)
Hemoglobin: 12.2 g/dL — ABNORMAL LOW (ref 13.0–17.0)
Lymphocytes Relative: 10 % — ABNORMAL LOW (ref 12–46)
Lymphs Abs: 0.8 10*3/uL (ref 0.7–4.0)
MCH: 30.9 pg (ref 26.0–34.0)
MCHC: 32.4 g/dL (ref 30.0–36.0)
MCV: 95.4 fL (ref 78.0–100.0)
Monocytes Absolute: 0.3 10*3/uL (ref 0.1–1.0)
Monocytes Relative: 3 % (ref 3–12)
Neutro Abs: 7.3 10*3/uL (ref 1.7–7.7)
Neutrophils Relative %: 86 % — ABNORMAL HIGH (ref 43–77)
PLATELETS: 205 10*3/uL (ref 150–400)
RBC: 3.95 MIL/uL — ABNORMAL LOW (ref 4.22–5.81)
RDW: 18.1 % — ABNORMAL HIGH (ref 11.5–15.5)
WBC: 8.5 10*3/uL (ref 4.0–10.5)

## 2014-04-15 LAB — GLUCOSE, CAPILLARY
Glucose-Capillary: 110 mg/dL — ABNORMAL HIGH (ref 70–99)
Glucose-Capillary: 137 mg/dL — ABNORMAL HIGH (ref 70–99)

## 2014-04-15 MED ORDER — SODIUM CHLORIDE 0.9 % IV SOLN
250.0000 mL | INTRAVENOUS | Status: DC | PRN
  Administered 2014-04-15: 250 mL via INTRAVENOUS

## 2014-04-15 MED ORDER — ALUM & MAG HYDROXIDE-SIMETH 200-200-20 MG/5ML PO SUSP: 30.0000 mL | Freq: Four times a day (QID) | ORAL | Status: DC | PRN

## 2014-04-15 MED ORDER — PANTOPRAZOLE SODIUM 40 MG PO TBEC
40.0000 mg | DELAYED_RELEASE_TABLET | Freq: Every day | ORAL | Status: DC
  Administered 2014-04-15 – 2014-04-16 (×2): 40 mg via ORAL
  Filled 2014-04-15: qty 1

## 2014-04-15 MED ORDER — ACETAMINOPHEN 500 MG PO TABS
500.0000 mg | ORAL_TABLET | ORAL | Status: DC | PRN
  Administered 2014-04-15: 500 mg via ORAL
  Filled 2014-04-15: qty 1

## 2014-04-15 MED ORDER — PROMETHAZINE HCL 25 MG PO TABS: 12.5000 mg | ORAL_TABLET | Freq: Four times a day (QID) | ORAL | Status: DC | PRN

## 2014-04-15 MED ORDER — SODIUM CHLORIDE 0.9 % IJ SOLN
3.0000 mL | Freq: Two times a day (BID) | INTRAMUSCULAR | Status: DC
  Administered 2014-04-16: 3 mL via INTRAVENOUS

## 2014-04-15 MED ORDER — BISACODYL 5 MG PO TBEC: 10.0000 mg | DELAYED_RELEASE_TABLET | Freq: Every day | ORAL | Status: DC | PRN

## 2014-04-15 MED ORDER — SODIUM CHLORIDE 0.9 % IJ SOLN: 3.0000 mL | INTRAMUSCULAR | Status: DC | PRN

## 2014-04-15 MED ORDER — DEXAMETHASONE SODIUM PHOSPHATE 4 MG/ML IJ SOLN
4.0000 mg | Freq: Four times a day (QID) | INTRAMUSCULAR | Status: DC
  Administered 2014-04-15: 4 mg via INTRAVENOUS
  Filled 2014-04-15: qty 1

## 2014-04-15 MED ORDER — LORAZEPAM 1 MG PO TABS: 1.0000 mg | ORAL_TABLET | Freq: Four times a day (QID) | ORAL | Status: DC | PRN

## 2014-04-15 MED ORDER — SODIUM CHLORIDE 0.9 % IJ SOLN
10.0000 mL | INTRAMUSCULAR | Status: DC | PRN
  Administered 2014-04-16: 10 mL
  Filled 2014-04-15: qty 40

## 2014-04-15 MED ORDER — PROCHLORPERAZINE MALEATE 10 MG PO TABS
10.0000 mg | ORAL_TABLET | Freq: Four times a day (QID) | ORAL | Status: DC | PRN
  Filled 2014-04-15: qty 1

## 2014-04-15 MED ORDER — SODIUM CHLORIDE 0.9 % IJ SOLN: 3.0000 mL | Freq: Two times a day (BID) | INTRAMUSCULAR | Status: DC

## 2014-04-15 MED ORDER — HEPARIN SODIUM (PORCINE) 5000 UNIT/ML IJ SOLN
5000.0000 [IU] | Freq: Three times a day (TID) | INTRAMUSCULAR | Status: DC
  Administered 2014-04-15 – 2014-04-16 (×3): 5000 [IU] via SUBCUTANEOUS
  Filled 2014-04-15 (×5): qty 1

## 2014-04-15 MED ORDER — INSULIN ASPART 100 UNIT/ML ~~LOC~~ SOLN
0.0000 [IU] | Freq: Three times a day (TID) | SUBCUTANEOUS | Status: DC
  Administered 2014-04-16: 1 [IU] via SUBCUTANEOUS
  Administered 2014-04-16: 2 [IU] via SUBCUTANEOUS

## 2014-04-15 MED ORDER — DOCUSATE SODIUM 100 MG PO CAPS
100.0000 mg | ORAL_CAPSULE | Freq: Two times a day (BID) | ORAL | Status: DC
  Administered 2014-04-15 – 2014-04-16 (×3): 100 mg via ORAL
  Filled 2014-04-15 (×3): qty 1

## 2014-04-15 MED ORDER — INSULIN ASPART 100 UNIT/ML ~~LOC~~ SOLN: 0.0000 [IU] | Freq: Every day | SUBCUTANEOUS | Status: DC

## 2014-04-15 MED ORDER — FESOTERODINE FUMARATE ER 4 MG PO TB24
4.0000 mg | ORAL_TABLET | Freq: Every day | ORAL | Status: DC
  Filled 2014-04-15: qty 1

## 2014-04-15 MED ORDER — OXYCODONE HCL 5 MG PO TABS
5.0000 mg | ORAL_TABLET | ORAL | Status: DC | PRN
  Administered 2014-04-15: 5 mg via ORAL
  Filled 2014-04-15: qty 1

## 2014-04-15 MED ORDER — ETODOLAC 400 MG PO TABS
400.0000 mg | ORAL_TABLET | Freq: Two times a day (BID) | ORAL | Status: DC
  Administered 2014-04-15 – 2014-04-16 (×2): 400 mg via ORAL
  Filled 2014-04-15 (×4): qty 1

## 2014-04-15 MED ORDER — POLYETHYLENE GLYCOL 3350 17 G PO PACK
17.0000 g | PACK | Freq: Every day | ORAL | Status: DC
  Administered 2014-04-15: 17 g via ORAL
  Filled 2014-04-15 (×2): qty 1

## 2014-04-15 MED ORDER — DEXAMETHASONE SODIUM PHOSPHATE 4 MG/ML IJ SOLN
4.0000 mg | Freq: Four times a day (QID) | INTRAMUSCULAR | Status: DC
  Administered 2014-04-15 – 2014-04-16 (×3): 4 mg via INTRAVENOUS
  Filled 2014-04-15 (×5): qty 1

## 2014-04-15 NOTE — Consult Note (Signed)
Radiation Oncology         [id number]) [phone] ________________________________  Initial inpatient Consultation  Name: Erik Gross MRN: [id number]  Date: 04/15/2014  DOB: Aug 16, 1955  OZ:HYQMVH,QION F., MD  No ref. provider found   REFERRING PHYSICIAN: Sherley Bounds, M.D.  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    ICD-9-CM ICD-10-CM   1. Brain tumor 239.6 D49.6   2. Transitional cell carcinoma of bladder 188.9 C67.9     HISTORY OF PRESENT ILLNESS::Erik Gross is a 59 y.o. 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 Postoperatively, he received mitomycin-C. The patient developed a recurrence requiring TURBT of a 5 cm recurrent tumor in September 2015 and pathology showed high-grade transitional cell carcinoma. With curative intent, the patient received neoadjuvant cisplatin and gemcitabine given on day 1 with gemcitabine given on day 8 having completed 3 cycles of chemotherapy in December 2015.  He presented to his PCP describing an approximately 2 history of headaches. At the time of his chemotherapy, the headaches were attributed to medication side effects and often controlled with over-the-counter medications and rest. The patient does note that he was significantly improved at the point time that he is being treated with Decadron with his chemotherapy. Headaches however resumed and at times he would be incapacitated for 24 hours. His primary care physician obtained an MRI done yesterday which showed a 5 cm right temporal lobe lesion with extensive surrounding edema and 11 mm of midline shift. The patient was admitted to the hospital for further management and has kindly been referred today to discuss potential radiation treatment options.  PREVIOUS RADIATION THERAPY: No  PAST MEDICAL HISTORY:  has a past medical history of Bladder tumor; Nocturia;  Arthritis; Wears glasses; and Cancer.    PAST SURGICAL HISTORY: Past Surgical History  Procedure Laterality Date  . Lumbar disc surgery  01-17-2013  . Inguinal hernia repair Right 1994  . Transurethral resection of bladder tumor N/A 02/25/2013    Procedure: TRANSURETHRAL RESECTION OF BLADDER TUMOR (TURBT) WITH POST OP MITOMYCIN-C;  Surgeon: Ardis Hughs, MD;  Location: [facility];  Service: Urology;  Laterality: N/A;  . Cystoscopy w/ retrogrades Bilateral 02/25/2013    Procedure: BILATERAL RETROGRADE PYELOGRAM;  Surgeon: Ardis Hughs, MD;  Location: [facility];  Service: Urology;  Laterality: Bilateral;  . Transurethral resection of bladder tumor with gyrus (turbt-gyrus) N/A 12/16/2013    Procedure: TRANSURETHRAL RESECTION OF BLADDER TUMOR AND CLOT EVACUATION;  Surgeon: Ardis Hughs, MD;  Location: [facility];  Service: Urology;  Laterality: N/A;    FAMILY HISTORY: family history is not on file.  SOCIAL HISTORY:  reports that he quit smoking about 10 years ago. His smoking use included Cigarettes. He has a 60 pack-year smoking history. He has never used smokeless tobacco. He reports that he does not drink alcohol or use illicit drugs.  ALLERGIES: Emend  MEDICATIONS:  Current Facility-Administered Medications  Medication Dose Route Frequency Provider Last Rate Last Dose  . 0.9 %  sodium chloride infusion  250 mL Intravenous PRN Melton Alar, PA-C 10 mL/hr at 04/15/14 1313 250 mL at 04/15/14 1313  . acetaminophen (TYLENOL) tablet 500 mg  500 mg Oral Q4H PRN Melton Alar, PA-C      . alum & mag hydroxide-simeth (MAALOX/MYLANTA) 200-200-20 MG/5ML suspension 30 mL  30 mL Oral Q6H PRN Melton Alar,  PA-C      . bisacodyl (DULCOLAX) EC tablet 10 mg  10 mg Oral Daily PRN Melton Alar, PA-C      . dexamethasone (DECADRON) injection 4 mg  4 mg Intravenous Q6H Nishant Dhungel, MD      . docusate sodium (COLACE) capsule 100 mg  100 mg Oral BID  Melton Alar, PA-C   100 mg at 04/15/14 1506  . etodolac (LODINE) tablet 400 mg  400 mg Oral BID Melton Alar, PA-C   400 mg at 04/15/14 1400  . heparin injection 5,000 Units  5,000 Units Subcutaneous 3 times per day Melton Alar, PA-C   5,000 Units at 04/15/14 1551  . insulin aspart (novoLOG) injection 0-5 Units  0-5 Units Subcutaneous QHS Marianne L York, PA-C      . insulin aspart (novoLOG) injection 0-9 Units  0-9 Units Subcutaneous TID WC Melton Alar, PA-C   0 Units at 04/15/14 1800  . LORazepam (ATIVAN) tablet 1 mg  1 mg Oral Q6H PRN Melton Alar, PA-C      . oxyCODONE (Oxy IR/ROXICODONE) immediate release tablet 5 mg  5 mg Oral Q4H PRN Melton Alar, PA-C   5 mg at 04/15/14 1551  . pantoprazole (PROTONIX) EC tablet 40 mg  40 mg Oral Daily Melton Alar, PA-C   40 mg at 04/15/14 1506  . polyethylene glycol (MIRALAX / GLYCOLAX) packet 17 g  17 g Oral Daily Melton Alar, PA-C   17 g at 04/15/14 1506  . prochlorperazine (COMPAZINE) tablet 10 mg  10 mg Oral Q6H PRN Melton Alar, PA-C      . promethazine (PHENERGAN) tablet 12.5 mg  12.5 mg Oral Q6H PRN Bobby Rumpf York, PA-C      . sodium chloride 0.9 % injection 10-40 mL  10-40 mL Intracatheter PRN Nishant Dhungel, MD      . sodium chloride 0.9 % injection 3 mL  3 mL Intravenous Q12H Marianne L York, PA-C   3 mL at 04/15/14 1315  . sodium chloride 0.9 % injection 3 mL  3 mL Intravenous Q12H Marianne L York, PA-C   3 mL at 04/15/14 1315  . sodium chloride 0.9 % injection 3 mL  3 mL Intravenous PRN Melton Alar, PA-C        REVIEW OF SYSTEMS:  A 15 point review of systems is documented in the electronic medical record. This was obtained by the nursing staff. However, I reviewed this with the patient to discuss relevant findings and make appropriate changes.  Pertinent items are noted in HPI.   PHYSICAL EXAM:  height is 5\' 6"  (1.676 m) and weight is 200 lb (90.719 kg). His oral temperature is 98.6 F (37 C). His blood  pressure is 117/58 and his pulse is 95. His respiration is 18 and oxygen saturation is 98%.   According to the emergency room physician: Constitutional: He is oriented to person, place, and time. He appears well-developed and well-nourished. HENT: Head: Normocephalic and atraumatic. Eyes: EOM are normal. Pupils are equal, round, and reactive to light. Neck: Neck supple. Cardiovascular: Normal rate, regular rhythm, normal heart sounds and intact distal pulses. Pulmonary/Chest: Effort normal and breath sounds normal. Abdominal: Soft. Bowel sounds are normal. He exhibits no distension. There is no tenderness. Musculoskeletal: Normal range of motion. He exhibits no edema. Neurological: He is alert and oriented to person, place, and time. He has normal strength. No cranial nerve deficit. He exhibits normal muscle  tone. Coordination normal. GCS eye subscore is 4. GCS verbal subscore is 5. GCS motor subscore is 6. At the time of his emergency room examination, the patient is cognitively intact. His speech is clear and situationally appropriate. He can perform finger-nose examination and heel shin examination in a coordinated manner. He has symmetric grips and symmetrical lower extremity muscle strength. Skin: Skin is warm, dry and intact. Psychiatric: He has a normal mood and affect On my examination this evening, I concur with the findings above.  KPS = 80  100 - Normal; no complaints; no evidence of disease. 90   - Able to carry on normal activity; minor signs or symptoms of disease. 80   - Normal activity with effort; some signs or symptoms of disease. 56   - Cares for self; unable to carry on normal activity or to do active work. 60   - Requires occasional assistance, but is able to care for most of his personal needs. 50   - Requires considerable assistance and frequent medical care. 11   - Disabled; requires special care and assistance. 57   - Severely disabled; hospital admission is indicated although  death not imminent. 6   - Very sick; hospital admission necessary; active supportive treatment necessary. 10   - Moribund; fatal processes progressing rapidly. 0     - Dead  Karnofsky DA, Abelmann Buchtel, Craver LS and Burchenal [facility] [phone]) The use of the nitrogen mustards in the palliative treatment of carcinoma: with particular reference to bronchogenic carcinoma Cancer 1 634-56  LABORATORY DATA:  Lab Results  Component Value Date   WBC 8.5 04/15/2014   HGB 12.2* 04/15/2014   HCT 37.7* 04/15/2014   MCV 95.4 04/15/2014   PLT 205 04/15/2014   Lab Results  Component Value Date   NA 138 04/15/2014   K 4.1 04/15/2014   CL 105 04/15/2014   CO2 22 04/15/2014   Lab Results  Component Value Date   ALT 11 04/15/2014   AST 20 04/15/2014   ALKPHOS 47 04/15/2014   BILITOT 0.7 04/15/2014     RADIOGRAPHY: Ct Chest W Contrast  03/22/2014   CLINICAL DATA:  Followup bladder cancer  EXAM: CT CHEST, ABDOMEN, AND PELVIS WITH CONTRAST  TECHNIQUE: Multidetector CT imaging of the chest, abdomen and pelvis was performed following the standard protocol during bolus administration of intravenous contrast.  CONTRAST:  13mL OMNIPAQUE IOHEXOL 300 MG/ML  SOLN  COMPARISON:  CT chest from 03/09/2013 and CT abdomen pelvis from 12/14/13  FINDINGS: CT CHEST FINDINGS  Mediastinum: The heart size appears within normal limits. There is no pericardial effusion identified. No mediastinal or hilar adenopathy identified. Calcified atherosclerotic plaque involves the thoracic aorta.  Lungs/Pleura: No pleural effusion identified. There is no airspace consolidation identified. No suspicious pulmonary nodule or mass.  Musculoskeletal: There is no aggressive lytic or sclerotic bone lesions identified.  CT ABDOMEN AND PELVIS FINDINGS  Hepatobiliary: The multifocal low-attenuation foci within the liver are identified and appear similar to previous exam most likely representing simple cysts. The appearance is stable. No suspicious liver  abnormality. The gallbladder is normal. No biliary dilatation.  Pancreas: Normal appearance of the pancreas.  Spleen: Calcified splenic granulomas identified.  Adrenals/Urinary Tract: The adrenal glands both appear normal. Unremarkable appearance of the kidneys. Left bladder wall has a maximum thickness of 0.9 cm, image 76/ series 2. This is compared with 3.3 cm previously. Soft tissue nodule at the bladder base is no longer visualized.  Stomach/Bowel: The stomach is  normal. The small bowel loops have a normal course and caliber without evidence for bowel obstruction. The appendix is visualized and appears normal. Extensive diverticular change involves the sigmoid colon.  Vascular/Lymphatic: Atherosclerotic disease involves the abdominal aorta. No aneurysm. No retroperitoneal or mesenteric adenopathy identified. There is no pelvic or inguinal adenopathy noted.  Reproductive: The prostate gland is unremarkable.  Other: No free fluid or fluid collections identified. There is a periumbilical hernia which contains fat only.  Musculoskeletal: Mild scoliosis deformity involves the lumbar spine which is convex towards the left. No aggressive lytic or sclerotic bone lesions identified.  IMPRESSION:  1. Significant decrease in size of enhancing mass involving the left lateral aspect of the urinary bladder. 2. No new findings and no evidence for metastatic disease. 3. Similar appearance of liver cysts. 4. Atherosclerotic disease   Electronically Signed   By: Kerby Moors M.D.   On: 03/22/2014 16:20   Ct Abdomen Pelvis W Contrast  03/22/2014   CLINICAL DATA:  Followup bladder cancer  EXAM: CT CHEST, ABDOMEN, AND PELVIS WITH CONTRAST  TECHNIQUE: Multidetector CT imaging of the chest, abdomen and pelvis was performed following the standard protocol during bolus administration of intravenous contrast.  CONTRAST:  143mL OMNIPAQUE IOHEXOL 300 MG/ML  SOLN  COMPARISON:  CT chest from 03/09/2013 and CT abdomen pelvis from 12/14/13   FINDINGS: CT CHEST FINDINGS  Mediastinum: The heart size appears within normal limits. There is no pericardial effusion identified. No mediastinal or hilar adenopathy identified. Calcified atherosclerotic plaque involves the thoracic aorta.  Lungs/Pleura: No pleural effusion identified. There is no airspace consolidation identified. No suspicious pulmonary nodule or mass.  Musculoskeletal: There is no aggressive lytic or sclerotic bone lesions identified.  CT ABDOMEN AND PELVIS FINDINGS  Hepatobiliary: The multifocal low-attenuation foci within the liver are identified and appear similar to previous exam most likely representing simple cysts. The appearance is stable. No suspicious liver abnormality. The gallbladder is normal. No biliary dilatation.  Pancreas: Normal appearance of the pancreas.  Spleen: Calcified splenic granulomas identified.  Adrenals/Urinary Tract: The adrenal glands both appear normal. Unremarkable appearance of the kidneys. Left bladder wall has a maximum thickness of 0.9 cm, image 76/ series 2. This is compared with 3.3 cm previously. Soft tissue nodule at the bladder base is no longer visualized.  Stomach/Bowel: The stomach is normal. The small bowel loops have a normal course and caliber without evidence for bowel obstruction. The appendix is visualized and appears normal. Extensive diverticular change involves the sigmoid colon.  Vascular/Lymphatic: Atherosclerotic disease involves the abdominal aorta. No aneurysm. No retroperitoneal or mesenteric adenopathy identified. There is no pelvic or inguinal adenopathy noted.  Reproductive: The prostate gland is unremarkable.  Other: No free fluid or fluid collections identified. There is a periumbilical hernia which contains fat only.  Musculoskeletal: Mild scoliosis deformity involves the lumbar spine which is convex towards the left. No aggressive lytic or sclerotic bone lesions identified.  IMPRESSION:  1. Significant decrease in size of  enhancing mass involving the left lateral aspect of the urinary bladder. 2. No new findings and no evidence for metastatic disease. 3. Similar appearance of liver cysts. 4. Atherosclerotic disease   Electronically Signed   By: Kerby Moors M.D.   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 I concur  with the initiation of dexamethasone therapy.  At this point, given the extent of peritumoral edema associated with his dominant brain metastasis, the patient would benefit from cytoreductive surgical resection of the brain metastasis. In addition, the patient would potentially benefit from radiotherapy. The options include whole brain irradiation versus stereotactic radiosurgery. There are pros and cons associated with each of these potential treatment options. Whole brain radiotherapy would treat the known metastatic deposits and help provide some reduction of risk for future brain metastases. However, whole brain radiotherapy carries potential risks including hair loss, subacute somnolence, and neurocognitive changes including a possible reduction in short-term memory. Whole brain radiotherapy also may carry a lower likelihood of tumor control at the treatment sites because of the low-dose used. Stereotactic radiosurgery carries a higher likelihood for local tumor control at the targeted sites with lower associated risk for neurocognitive changes such as memory loss.* However, the use of stereotactic radiosurgery in this setting may leave the patient at increased risk for new brain metastases elsewhere in the brain as high as 50-60%. Accordingly, patients who receive stereotactic radiosurgery in this setting should undergo ongoing surveillance imaging with brain MRI more frequently in order to identify  and treat new small brain metastases before they become symptomatic. Stereotactic radiosurgery does carry some different risks, including a risk of radionecrosis.  PLAN: Today, I reviewed the findings and workup thus far with the patient. We discussed the options regarding whole brain radiotherapy versus stereotactic radiosurgery. We discussed the pros and cons of each. We also discussed the logistics and delivery of each. We reviewed the results associated with each of the treatments described above.   We had a frank discussion about prognosis and also the patient's desire not receive further chemo, at least at this time.  We talked about the concept of being more aggressive versus less aggressive, and also discussed the fact that being aggressive not does not resign him to being aggressive later or vice versa.  Given the patient's current good performance status and good cognition, I would recommend that he consider treating the brain.  Following that, we can revisit whether additional systemic therapy or bladder treatment is warranted.  The patient and his family understand the treatment options and would like to proceed with stereotactic radiosurgery.  In terms of timing of the stereotactic radiosurgery, evidence suggests that risk of radionecrosis and leptomeningeal recurrence is lower when radiosurgery is used in the pre-operative setting as opposed to post-operative SRS.*  The patient will be transferred to [facility] long hospital via [place] tomorrow where he will be set up for [place] followed by possible discharge to home in preparation for [facility] Treatment 04/20/2014, Surgical resection 04/21/2014.  I spent face to face time with the patient and more than 50% of that time was spent in counseling and/or coordination of care.    ------------------------------------------------  Sheral Apley. Tammi Klippel, M.D.                             *References: 1: Erlene Senters, Wenda Low, Hess KR, Tomie China, Lang FF, [place], [place], Swint JM, Shiu AS, Maor MH, [place]appa [place]. Neurocognition in patients with brain metastases treated with radiosurgery or radiosurgery plus whole-brain irradiation: a randomised controlled trial. Lancet Oncol. 2009 Nov;10(11):1037-44. doi: 10.1016/S1470-2045(09)70263-3. Epub 2009 Oct 2. PubMed PMID: [id number].  2: Weyman Rodney, Estill Dooms, Coralee Pesa, Crocker IR, Lorie Phenix, Charlesetta Garibaldi, Press  RH, Tanya Nones, [place], Wait SD, Higinio Plan, Shu HG, [place]. Comparing Preoperative With Postoperative Stereotactic Radiosurgery for Resectable Brain Metastases: A Multi-institutional Analysis. Neurosurgery. 2015 Nov 2. [Epub ahead of print] PubMed PMID: [id number].

## 2014-04-15 NOTE — H&P (Signed)
[facility] History and Physical                                                                                    Patient Demographics  Erik Gross, is a 59 y.o. male  MRN: [id number]   DOB - [birth year]  Admit Date - 04/15/2014  Outpatient Primary MD for the patient is Rusty Aus., MD  Oncologist:  Zola Button, MD   With History of -  Past Medical History  Diagnosis Date  . Bladder tumor   . Nocturia   . Arthritis   . Wears glasses   . Cancer       Past Surgical History  Procedure Laterality Date  . Lumbar disc surgery  01-17-2013  . Inguinal hernia repair Right 1994  . Transurethral resection of bladder tumor N/A 02/25/2013    Procedure: TRANSURETHRAL RESECTION OF BLADDER TUMOR (TURBT) WITH POST OP MITOMYCIN-C;  Surgeon: Ardis Hughs, MD;  Location: [facility];  Service: Urology;  Laterality: N/A;  . Cystoscopy w/ retrogrades Bilateral 02/25/2013    Procedure: BILATERAL RETROGRADE PYELOGRAM;  Surgeon: Ardis Hughs, MD;  Location: [facility];  Service: Urology;  Laterality: Bilateral;  . Transurethral resection of bladder tumor with gyrus (turbt-gyrus) N/A 12/16/2013    Procedure: TRANSURETHRAL RESECTION OF BLADDER TUMOR AND CLOT EVACUATION;  Surgeon: Ardis Hughs, MD;  Location: [facility];  Service: Urology;  Laterality: N/A;    in for   Chief Complaint  Patient presents with  . Headache     HPI  Erik Gross  is a 59 y.o. 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  He is currently undergoing chemotherapy with cisplatin and gemcitabine.  He went to his primary care physician due to severe headache that had been intermittent over the past 2 months. The patient attributed the headache to side effects of chemotherapy and to a possible sinus infection. Consequently, he delayed treatment for it. 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 The patient and his wife describe severe frontal headache controlled with etodolac, intermittent dizziness when walking, and intermittent confusion. As an illustration of his confusion and his wife states that he will put his shirt on inside out and be unable to figure out how to correct it. Of note Mr. Brashear is still functioning well and actively working.  Workup in the ER was unremarkable. The patient's case was discussed with neurosurgery, oncology, and radiation oncology. Dr. Ronnald Ramp of neurosurgery requested a metastatic workup and evaluation by radiation oncology in order to determine whether or not neurosurgery was appropriate.  Review of Systems    In addition to the HPI above,  The patient reports intermittent chills and sweats, but no fever He reports intermittent dizziness. His wife reports that the patient is intermittently confused. He denies changes to Vision or hearing, No problems swallowing food or Liquids, No Chest pain, Cough or Shortness of Breath, No Abdominal pain, No Nausea or Vomiting, Bowel movements are regular, No Blood in stool or Urine,  No dysuria, No new skin rashes or bruises, No new joints pains-aches,  No new weakness, tingling, numbness in any extremity, No recent weight gain or loss, No polyuria, polydypsia or polyphagia, No significant Mental Stressors.  A full 10 point Review of Systems was done, except as stated above, all other Review of Systems were negative.   Social History History  Substance Use Topics  . Smoking status: Former Smoker -- 2.00 packs/day for 30 years    Types: Cigarettes    Quit date: 02/24/2004  . Smokeless tobacco: Never Used  . Alcohol Use: No   he smokes cigarettes and drank alcohol for approximately 20 years. he stopped both at the same time approximately 10 years ago.    Family History History reviewed. No pertinent family history.   there is no previous history of brain cancer or brain tumors in the family. His father died with heart disease.    Prior to Admission medications   Medication Sig Start Date End Date Taking? Authorizing Provider  acetaminophen (TYLENOL) 500 MG tablet Take 500 mg by mouth every 4 (four) hours as needed for mild pain or headache.    Yes Historical Provider, MD  etodolac (LODINE) 400 MG tablet Take 400 mg by mouth 2 (two) times daily.   Yes Historical Provider, MD  Multiple Vitamin (MULTIVITAMIN) tablet Take 1 tablet by mouth daily.   Yes Historical Provider, MD  bisacodyl (STIMULANT LAXATIVE) 5 MG EC tablet Take 10 mg by mouth daily as needed for mild constipation.     Historical Provider, MD  calcium carbonate (TUMS - DOSED IN MG ELEMENTAL CALCIUM) 500 MG chewable tablet Chew 1 tablet by mouth daily.    Historical Provider, MD  dexamethasone (DECADRON) 4 MG tablet Take one tablet every 8 hours for 3 days after chemotherapy. Patient not taking: Reported on 04/15/2014 02/12/14   Wyatt Portela, MD  docusate sodium (COLACE) 100 MG capsule Take 1 capsule (100 mg total) by mouth 2 (two) times daily as needed (take to keep stool soft.). Patient not taking: Reported on 04/15/2014 12/17/13   Ardis Hughs, MD  Influenza Vac Split Quad (FLUZONE) 0.25 ML injection Inject 0.25 mLs into the muscle once.    Historical Provider, MD  lidocaine-prilocaine (EMLA) cream Apply 1 application topically as needed. Apply to port on the day of chemotherapy. Patient not taking: Reported on 04/15/2014 12/29/13   Wyatt Portela, MD  LORazepam (ATIVAN) 1 MG tablet Take 1 tablet (1 mg total) by mouth every 6 (six) hours as needed for anxiety. Patient not taking: Reported on 04/15/2014 02/12/14   Wyatt Portela, MD  ondansetron (ZOFRAN) 8 MG tablet Take 1 tablet (8 mg total) by mouth every 8 (eight) hours as needed for nausea or vomiting. Patient not taking: Reported on 04/15/2014 02/04/14   Carlton Adam, PA-C  oxyCODONE  (ROXICODONE) 5 MG immediate release tablet Take 1 tablet (5 mg total) by mouth every 4 (four) hours as needed. Patient not taking: Reported on 04/15/2014 12/17/13   Ardis Hughs, MD  polyethylene glycol [facility] / Floria Raveling) packet Take 17 g by mouth daily.    Historical Provider, MD  prochlorperazine (COMPAZINE) 10 MG tablet Take 1 tablet (10 mg total) by mouth every 6 (six) hours as needed for nausea or vomiting. Patient not taking: Reported on 04/15/2014 02/04/14   Adrena E Johnson, PA-C  TOVIAZ 4 MG TB24 tablet Take 4 mg by mouth daily. 12/18/13   Historical Provider, MD  TURMERIC PO  Take 1 tablet by mouth daily.    Historical Provider, MD  Wheat Dextrin (BENEFIBER DRINK MIX PO) Take 1 packet by mouth daily.     Historical Provider, MD  zolpidem (AMBIEN) 5 MG tablet Take 1 tablet (5 mg total) by mouth at bedtime as needed for sleep. Patient not taking: Reported on 04/15/2014 12/17/13   Theodis Blaze, MD    Allergies  Allergen Reactions  . Emend [Fosaprepitant] Shortness Of Breath and Other (See Comments)    Bright red face.    Physical Exam  Vitals  Blood pressure 120/68, pulse 90, temperature 97.6 F (36.4 C), temperature source Oral, resp. rate 16, height 5\' 6"  (1.676 m), weight 90.719 kg (200 lb), SpO2 97 %.   General:  well-developed, well-nourished male, lying in bed in NAD,  he frequently turns to his wife for answers to questions  Psych:  Normal affect and insight, Not Suicidal or Homicidal, Awake Alert, Oriented X 3.  Neuro:   No F.N deficits, ALL C.Nerves Intact, Strength 5/5 all 4 extremities, Sensation intact all 4 extremities.  ENT:  Ears and Eyes appear Normal, Conjunctivae clear, PERR. Moist Oral Mucosa.  Neck:  Supple Neck, No JVD, 3-4 inch area of puffiness in the right supraclavicular area (lipoma?) Per his wife this is been there for a long time   Respiratory:  Symmetrical Chest wall movement, Good air movement bilaterally, CTAB.  Cardiac:  RRR, No Gallops,  Rubs or Murmurs, No Parasternal Heave.  Abdomen:  Positive Bowel Sounds, Abdomen Soft, Non tender, No organomegaly appreciated  Skin:  No Cyanosis, Normal Skin Turgor, No Skin Rash or Bruise.  Extremities:  Good muscle tone,  joints appear normal , no effusions, Normal ROM.   Data Review  CBC  Recent Labs Lab 04/15/14 0957  WBC 8.5  HGB 12.2*  HCT 37.7*  PLT 205  MCV 95.4  MCH 30.9  MCHC 32.4  RDW 18.1*  LYMPHSABS 0.8  MONOABS 0.3  EOSABS 0.0  BASOSABS 0.0   ------------------------------------------------------------------------------------------------------------------  Chemistries   Recent Labs Lab 04/15/14 0957  NA 138  K 4.1  CL 105  CO2 22  GLUCOSE 163*  BUN 12  CREATININE 1.04  CALCIUM 9.2  AST 20  ALT 11  ALKPHOS 47  BILITOT 0.7     ----------------------------------------------------------------------------------------------------------------  Imaging results:   Ct Chest W Contrast  03/22/2014   CLINICAL DATA:  Followup bladder cancer  EXAM: CT CHEST, ABDOMEN, AND PELVIS WITH CONTRAST  TECHNIQUE: Multidetector CT imaging of the chest, abdomen and pelvis was performed following the standard protocol during bolus administration of intravenous contrast.  CONTRAST:  167mL OMNIPAQUE IOHEXOL 300 MG/ML  SOLN  COMPARISON:  CT chest from 03/09/2013 and CT abdomen pelvis from 12/14/13  FINDINGS: CT CHEST FINDINGS  Mediastinum: The heart size appears within normal limits. There is no pericardial effusion identified. No mediastinal or hilar adenopathy identified. Calcified atherosclerotic plaque involves the thoracic aorta.  Lungs/Pleura: No pleural effusion identified. There is no airspace consolidation identified. No suspicious pulmonary nodule or mass.  Musculoskeletal: There is no aggressive lytic or sclerotic bone lesions identified.  CT ABDOMEN AND PELVIS FINDINGS  Hepatobiliary: The multifocal low-attenuation foci within the liver are identified and appear  similar to previous exam most likely representing simple cysts. The appearance is stable. No suspicious liver abnormality. The gallbladder is normal. No biliary dilatation.  Pancreas: Normal appearance of the pancreas.  Spleen: Calcified splenic granulomas identified.  Adrenals/Urinary Tract: The adrenal glands both appear normal. Unremarkable  appearance of the kidneys. Left bladder wall has a maximum thickness of 0.9 cm, image 76/ series 2. This is compared with 3.3 cm previously. Soft tissue nodule at the bladder base is no longer visualized.  Stomach/Bowel: The stomach is normal. The small bowel loops have a normal course and caliber without evidence for bowel obstruction. The appendix is visualized and appears normal. Extensive diverticular change involves the sigmoid colon.  Vascular/Lymphatic: Atherosclerotic disease involves the abdominal aorta. No aneurysm. No retroperitoneal or mesenteric adenopathy identified. There is no pelvic or inguinal adenopathy noted.  Reproductive: The prostate gland is unremarkable.  Other: No free fluid or fluid collections identified. There is a periumbilical hernia which contains fat only.  Musculoskeletal: Mild scoliosis deformity involves the lumbar spine which is convex towards the left. No aggressive lytic or sclerotic bone lesions identified.  IMPRESSION:  1. Significant decrease in size of enhancing mass involving the left lateral aspect of the urinary bladder. 2. No new findings and no evidence for metastatic disease. 3. Similar appearance of liver cysts. 4. Atherosclerotic disease   Electronically Signed   By: Kerby Moors M.D.   On: 03/22/2014 16:20   Ct Abdomen Pelvis W Contrast  03/22/2014   CLINICAL DATA:  Followup bladder cancer  EXAM: CT CHEST, ABDOMEN, AND PELVIS WITH CONTRAST  TECHNIQUE: Multidetector CT imaging of the chest, abdomen and pelvis was performed following the standard protocol during bolus administration of intravenous contrast.  CONTRAST:   112mL OMNIPAQUE IOHEXOL 300 MG/ML  SOLN  COMPARISON:  CT chest from 03/09/2013 and CT abdomen pelvis from 12/14/13  FINDINGS: CT CHEST FINDINGS  Mediastinum: The heart size appears within normal limits. There is no pericardial effusion identified. No mediastinal or hilar adenopathy identified. Calcified atherosclerotic plaque involves the thoracic aorta.  Lungs/Pleura: No pleural effusion identified. There is no airspace consolidation identified. No suspicious pulmonary nodule or mass.  Musculoskeletal: There is no aggressive lytic or sclerotic bone lesions identified.  CT ABDOMEN AND PELVIS FINDINGS  Hepatobiliary: The multifocal low-attenuation foci within the liver are identified and appear similar to previous exam most likely representing simple cysts. The appearance is stable. No suspicious liver abnormality. The gallbladder is normal. No biliary dilatation.  Pancreas: Normal appearance of the pancreas.  Spleen: Calcified splenic granulomas identified.  Adrenals/Urinary Tract: The adrenal glands both appear normal. Unremarkable appearance of the kidneys. Left bladder wall has a maximum thickness of 0.9 cm, image 76/ series 2. This is compared with 3.3 cm previously. Soft tissue nodule at the bladder base is no longer visualized.  Stomach/Bowel: The stomach is normal. The small bowel loops have a normal course and caliber without evidence for bowel obstruction. The appendix is visualized and appears normal. Extensive diverticular change involves the sigmoid colon.  Vascular/Lymphatic: Atherosclerotic disease involves the abdominal aorta. No aneurysm. No retroperitoneal or mesenteric adenopathy identified. There is no pelvic or inguinal adenopathy noted.  Reproductive: The prostate gland is unremarkable.  Other: No free fluid or fluid collections identified. There is a periumbilical hernia which contains fat only.  Musculoskeletal: Mild scoliosis deformity involves the lumbar spine which is convex towards the  left. No aggressive lytic or sclerotic bone lesions identified.  IMPRESSION:  1. Significant decrease in size of enhancing mass involving the left lateral aspect of the urinary bladder. 2. No new findings and no evidence for metastatic disease. 3. Similar appearance of liver cysts. 4. Atherosclerotic disease   Electronically Signed   By: Kerby Moors M.D.   On: 03/22/2014 16:20  04/14/2014 MRI brain with and without contrast: Impression: 5 enhancing brain lesions, consistent with metastasis. The largest measures 5.1 cm in the right temporal lobe with extensive surrounding edema resulting in subfalcine and uncal herniation.  11 mm midline shift.     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 Sherley Bounds will keep the patient on his list to follow for potential neurosurgery if oncology indicates this is appropriate. Dr. Alen Blew agrees with metastatic workup and radiation oncology consultation. He is aware the patient is in house. Dr. Ledon Snare of radiation oncology has been consulted. While the patient recently had CT of his chest abdomen and pelvis, we will repeat these tests searching for a source of brain metastasis. We will admit the patient to telemetry bed. Start IV Decadron 4 mg every 6. Order PT/OT consults. Further recommendations to follow as workup unfolds.  Headache Appears to be controlled with etodolac. Will continue this home medication as well as when necessary OxyIR and other supportive medications such as Phenergan.  Hyperglycemia Likely steroid induced. We'll place the patient on before meals and at bedtime sliding scale sensitive NovoLog and Protonix.  Transitional cell bladder carcinoma Continued management per oncology.   DVT Prophylaxis: Heparin  AM Labs Ordered,  also please review Full Orders  Family Communication:   Wife and POA, Sherri at bedside.   Code Status:  Full.  But patient request no long term life support.  Likely DC to  Home when appropriate.  Condition:  guarded  Time spent in minutes : 60    [place], Bobby Rumpf PA-C on 04/15/2014 at 12:38 PM  Between 7am to 7pm - Pager - [phone]  After 7pm go to [email or web address] - password [email or web address]  And look for the night coverage person covering me after hours  [facility]

## 2014-04-15 NOTE — ED Notes (Signed)
Attempted report 

## 2014-04-15 NOTE — ED Provider Notes (Signed)
CSN: [id number]     Arrival date & time 04/15/14  1607 History   First MD Initiated Contact with Patient 04/15/14 [phone]     Chief Complaint  Patient presents with  . Headache     (Consider location/radiation/quality/duration/timing/severity/associated sxs/prior Treatment) HPI The patient has known bladder cancer. He had been undergoing treatment and as of approximately 2 months ago started developing headaches. At that time the headaches were attributed to medication side effects and often controlled with over-the-counter medications and rest. The patient does note that he was significantly improved at the point time that he is being treated with Decadron with his chemotherapy. Headaches however resumed and at times he would be incapacitated for 24 hours. His primary care physician obtained an MRI done yesterday which showed a 5 cm right temporal lobe lesion with extensive surrounding edema and 11 mm of midline shift. He is referred to the emergency department for establishing ongoing treatment. The patient does not currently have a headache and got excellent pain relief by and the Etodolac prescribed by his primary care physician. His wife notes there are certain types of confusion present. The patient is high functioning with electronics and has still managed to go to work on a fairly regular basis. She notes however certain things such as putting his shoes on the wrong feet or putting his close on inside out might occur and the patient doesn't recognize the error. She reports his memory recall however seems to be fairly good. Past Medical History  Diagnosis Date  . Bladder tumor   . Nocturia   . Arthritis   . Wears glasses   . Cancer    Past Surgical History  Procedure Laterality Date  . Lumbar disc surgery  01-17-2013  . Inguinal hernia repair Right 1994  . Transurethral resection of bladder tumor N/A 02/25/2013    Procedure: TRANSURETHRAL RESECTION OF BLADDER TUMOR (TURBT) WITH POST OP  MITOMYCIN-C;  Surgeon: Ardis Hughs, MD;  Location: [facility];  Service: Urology;  Laterality: N/A;  . Cystoscopy w/ retrogrades Bilateral 02/25/2013    Procedure: BILATERAL RETROGRADE PYELOGRAM;  Surgeon: Ardis Hughs, MD;  Location: [facility];  Service: Urology;  Laterality: Bilateral;  . Transurethral resection of bladder tumor with gyrus (turbt-gyrus) N/A 12/16/2013    Procedure: TRANSURETHRAL RESECTION OF BLADDER TUMOR AND CLOT EVACUATION;  Surgeon: Ardis Hughs, MD;  Location: [facility];  Service: Urology;  Laterality: N/A;   History reviewed. No pertinent family history. History  Substance Use Topics  . Smoking status: Former Smoker -- 2.00 packs/day for 30 years    Types: Cigarettes    Quit date: 02/24/2004  . Smokeless tobacco: Never Used  . Alcohol Use: No    Review of Systems 10 Systems reviewed and are negative for acute change except as noted in the HPI.   Allergies  Emend  Home Medications   Prior to Admission medications   Medication Sig Start Date End Date Taking? Authorizing Provider  acetaminophen (TYLENOL) 500 MG tablet Take 500 mg by mouth every 4 (four) hours as needed for mild pain or headache.    Yes Historical Provider, MD  etodolac (LODINE) 400 MG tablet Take 400 mg by mouth 2 (two) times daily.   Yes Historical Provider, MD  Multiple Vitamin (MULTIVITAMIN) tablet Take 1 tablet by mouth daily.   Yes Historical Provider, MD  bisacodyl (STIMULANT LAXATIVE) 5 MG EC tablet Take 10 mg by mouth daily as needed for mild constipation.  Historical Provider, MD  calcium carbonate (TUMS - DOSED IN MG ELEMENTAL CALCIUM) 500 MG chewable tablet Chew 1 tablet by mouth daily.    Historical Provider, MD  dexamethasone (DECADRON) 4 MG tablet Take one tablet every 8 hours for 3 days after chemotherapy. Patient not taking: Reported on 04/15/2014 02/12/14   Wyatt Portela, MD  docusate sodium (COLACE) 100 MG capsule Take 1  capsule (100 mg total) by mouth 2 (two) times daily as needed (take to keep stool soft.). Patient not taking: Reported on 04/15/2014 12/17/13   Ardis Hughs, MD  Influenza Vac Split Quad (FLUZONE) 0.25 ML injection Inject 0.25 mLs into the muscle once.    Historical Provider, MD  lidocaine-prilocaine (EMLA) cream Apply 1 application topically as needed. Apply to port on the day of chemotherapy. Patient not taking: Reported on 04/15/2014 12/29/13   Wyatt Portela, MD  LORazepam (ATIVAN) 1 MG tablet Take 1 tablet (1 mg total) by mouth every 6 (six) hours as needed for anxiety. Patient not taking: Reported on 04/15/2014 02/12/14   Wyatt Portela, MD  ondansetron (ZOFRAN) 8 MG tablet Take 1 tablet (8 mg total) by mouth every 8 (eight) hours as needed for nausea or vomiting. Patient not taking: Reported on 04/15/2014 02/04/14   Carlton Adam, PA-C  oxyCODONE (ROXICODONE) 5 MG immediate release tablet Take 1 tablet (5 mg total) by mouth every 4 (four) hours as needed. Patient not taking: Reported on 04/15/2014 12/17/13   Ardis Hughs, MD  polyethylene glycol [facility] / Floria Raveling) packet Take 17 g by mouth daily.    Historical Provider, MD  prochlorperazine (COMPAZINE) 10 MG tablet Take 1 tablet (10 mg total) by mouth every 6 (six) hours as needed for nausea or vomiting. Patient not taking: Reported on 04/15/2014 02/04/14   Adrena E Johnson, PA-C  TOVIAZ 4 MG TB24 tablet Take 4 mg by mouth daily. 12/18/13   Historical Provider, MD  TURMERIC PO Take 1 tablet by mouth daily.    Historical Provider, MD  Wheat Dextrin (BENEFIBER DRINK MIX PO) Take 1 packet by mouth daily.     Historical Provider, MD  zolpidem (AMBIEN) 5 MG tablet Take 1 tablet (5 mg total) by mouth at bedtime as needed for sleep. Patient not taking: Reported on 04/15/2014 12/17/13   Theodis Blaze, MD   BP 120/64 mmHg  Pulse 92  Temp(Src) 97.6 F (36.4 C) (Oral)  Resp 23  Ht 5\' 6"  (1.676 m)  Wt 200 lb (90.719 kg)  BMI 32.30 kg/m2   SpO2 97% Physical Exam  Constitutional: He is oriented to person, place, and time. He appears well-developed and well-nourished.  HENT:  Head: Normocephalic and atraumatic.  Eyes: EOM are normal. Pupils are equal, round, and reactive to light.  Neck: Neck supple.  Cardiovascular: Normal rate, regular rhythm, normal heart sounds and intact distal pulses.   Pulmonary/Chest: Effort normal and breath sounds normal.  Abdominal: Soft. Bowel sounds are normal. He exhibits no distension. There is no tenderness.  Musculoskeletal: Normal range of motion. He exhibits no edema.  Neurological: He is alert and oriented to person, place, and time. He has normal strength. No cranial nerve deficit. He exhibits normal muscle tone. Coordination normal. GCS eye subscore is 4. GCS verbal subscore is 5. GCS motor subscore is 6.  At this time the patient is cognitively intact. His speech is clear and situationally appropriate. He can perform finger-nose examination and heel shin examination in a coordinated manner. He has  symmetric grips and symmetrical lower extremity muscle strength.  Skin: Skin is warm, dry and intact.  Psychiatric: He has a normal mood and affect.    ED Course  Procedures (including critical care time) Labs Review Labs Reviewed  COMPREHENSIVE METABOLIC PANEL - Abnormal; Notable for the following:    Glucose, Bld 163 (*)    GFR calc non Af Amer 76 (*)    GFR calc Af Amer 88 (*)    All other components within normal limits  CBC WITH DIFFERENTIAL/PLATELET - Abnormal; Notable for the following:    RBC 3.95 (*)    Hemoglobin 12.2 (*)    HCT 37.7 (*)    RDW 18.1 (*)    Neutrophils Relative % 86 (*)    Lymphocytes Relative 10 (*)    All other components within normal limits    Imaging Review No results found.   EKG Interpretation None     Consult:[facility] advises for medical admission for further consultations with oncology to define tumor type and distributions for  definitive treatment. Consult: [facility] hospitalist for admission  MDM   Final diagnoses:  Brain tumor  Transitional cell carcinoma of bladder   The patient presents as outlined above with identified metastatic brain tumor with edema and midline shift. Currently his mental status and neurologic examinations are stable. Consultation is made to neurosurgery for treatment plan. At this time further diagnostic study is needed and the recommendation is for medical admission.    Charlesetta Shanks, MD 04/15/14 1124

## 2014-04-15 NOTE — Progress Notes (Signed)
I was asked to look at the MRI of this patient with a history of bladder cancer. Recent CT scan of the chest abdomen and pelvis done 3 weeks ago show no evidence of metastatic disease and decrease in size of the left bladder tumor. MRI of the brain showed multiple metastasis (5) with a dominant right posterior temporal lesion. He was sent to the emergency department by his PCP for further workup and therapy. He is still working. According to the ED P,  he looks good on exam. Given the fact that he has 5 metastatic lesions in his brain this typically would be a medically -based treatment regimen rather than surgical. However he does have a dominant right posterior temporal lesion which could potentially be resected if felt to be symptomatic, immediately life-threatening, or causing problematic mass effect. I have asked that he be evaluated by his oncologist and by Dr. Ledon Snare of radiation oncology. I assume this is metastatic bladder cancer for now. At least 4 the lesions will be amenable to [facility], the question will be the largest fifth lesion. He will be admitted and placed on Decadron. He will likely be discussed at tumor conference. I am certainly available should it be deemed that the right temporal lesion should be surgically resected. He would then need further imaging with MRI with curve protocol such that frameless stereotactic navigation could be used during resection.

## 2014-04-15 NOTE — Progress Notes (Signed)
UR completed.  Pascale Maves, RN BSN M[facility] CCM Trauma/Neuro ICU Case Manager [phone]  

## 2014-04-15 NOTE — Progress Notes (Signed)
  Radiation Oncology         [phone]) [phone] ________________________________  Name: Erik Gross MRN: [id number]  Date: 04/15/2014  DOB: 04-Jun-1955  Quick Chart Note:  I have been asked to see this patient in consultation and plan to see him later this afternoon. Based on my remote review of his chart, films and discussion with neurosurgery, we are tentatively moving toward possible stereotactic radiosurgery to all 5 lesions followed by surgical resection of the dominant right temporal lesion. In order to proceed, if there are no contraindications, I would transfer the patient to the [facility] hospitalist service team this afternoon.  ________________________________  Sheral Apley Tammi Klippel, M.D.

## 2014-04-15 NOTE — ED Notes (Signed)
IV team at bedside for [facility] access.

## 2014-04-15 NOTE — ED Notes (Addendum)
Pt c/o HA; pt with hx of bladder CA and had MRI that showed brain tumors with edema and possible shift; pt sts some issues with balance at times

## 2014-04-16 ENCOUNTER — Ambulatory Visit
Admit: 2014-04-16 | Discharge: 2014-04-16 | Disposition: A | Discharge status: Home / Self Care | Payer: BLUE CROSS/BLUE SHIELD | Attending: Radiation Oncology | Admitting: Radiation Oncology

## 2014-04-16 DIAGNOSIS — Z510 Encounter for antineoplastic radiation therapy: Secondary | ICD-10-CM | POA: Insufficient documentation

## 2014-04-16 DIAGNOSIS — B370 Candidal stomatitis: Secondary | ICD-10-CM | POA: Insufficient documentation

## 2014-04-16 DIAGNOSIS — C679 Malignant neoplasm of bladder, unspecified: Secondary | ICD-10-CM | POA: Insufficient documentation

## 2014-04-16 DIAGNOSIS — C7931 Secondary malignant neoplasm of brain: Secondary | ICD-10-CM | POA: Insufficient documentation

## 2014-04-16 DIAGNOSIS — D500 Iron deficiency anemia secondary to blood loss (chronic): Secondary | ICD-10-CM | POA: Insufficient documentation

## 2014-04-16 LAB — GLUCOSE, CAPILLARY
Glucose-Capillary: 134 mg/dL — ABNORMAL HIGH (ref 70–99)
Glucose-Capillary: 161 mg/dL — ABNORMAL HIGH (ref 70–99)

## 2014-04-16 LAB — BASIC METABOLIC PANEL
ANION GAP: 6 (ref 5–15)
BUN: 12 mg/dL (ref 6–23)
CHLORIDE: 106 mmol/L (ref 96–112)
CO2: 27 mmol/L (ref 19–32)
Calcium: 9.7 mg/dL (ref 8.4–10.5)
Creatinine, Ser: 1.04 mg/dL (ref 0.50–1.35)
GFR calc Af Amer: 88 mL/min — ABNORMAL LOW (ref >90)
GFR calc non Af Amer: 76 mL/min — ABNORMAL LOW (ref >90)
Glucose, Bld: 138 mg/dL — ABNORMAL HIGH (ref 70–99)
POTASSIUM: 4.5 mmol/L (ref 3.5–5.1)
SODIUM: 139 mmol/L (ref 135–145)

## 2014-04-16 MED ORDER — DEXAMETHASONE 4 MG PO TABS
4.0000 mg | ORAL_TABLET | Freq: Four times a day (QID) | ORAL | Status: DC
  Filled 2014-04-16 (×4): qty 1

## 2014-04-16 MED ORDER — HEPARIN SOD (PORK) LOCK FLUSH 100 UNIT/ML IV SOLN
500.0000 [IU] | INTRAVENOUS | Status: AC | PRN
  Administered 2014-04-16: 500 [IU]

## 2014-04-16 MED ORDER — DEXAMETHASONE 4 MG PO TABS: 4.0000 mg | ORAL_TABLET | Freq: Four times a day (QID) | ORAL | Status: DC

## 2014-04-16 NOTE — Progress Notes (Signed)
Patient left unit to [facility] for a procedure. Transported by [facility].

## 2014-04-16 NOTE — Consult Note (Signed)
  Radiation Oncology         [phone]) [phone] ________________________________  Name: Erik Gross MRN: [id number]  Date: 04/15/2014  DOB: [birth year]  SIMULATION AND TREATMENT PLANNING NOTE   DIAGNOSIS:  59 yo man with 5 brain metastases from metastatic bladder cancer  NARRATIVE:  The patient was brought to the [facility].  Identity was confirmed.  All relevant records and images related to the planned course of therapy were reviewed.  The patient freely provided informed written consent to proceed with treatment after reviewing the details related to the planned course of therapy. The consent form was witnessed and verified by the simulation staff. Intravenous access was established for contrast administration. Then, the patient was set-up in a stable reproducible supine position for radiation therapy.  A relocatable thermoplastic stereotactic head frame was fabricated for precise immobilization.  CT images were obtained.  Surface markings were placed.  The CT images were loaded into the planning software and fused with the patient's targeting MRI scan.  Then the target and avoidance structures were contoured.  Treatment planning then occurred.  The radiation prescription was entered and confirmed.  I have requested 3D planning  I have requested a DVH of the following structures: Brain stem, brain, left eye, right eye, lenses, optic chiasm, target volumes, uninvolved brain, and normal tissue.    PLAN:  The patient will receive 14 Gy in one fraction pre-operatively to his dominant right temporal metastasis and 20 Gy in one fraction to his other 4 smaller metastases.  ________________________________  Sheral Apley Tammi Klippel, M.D.

## 2014-04-16 NOTE — Discharge Summary (Signed)
PATIENT DETAILS Name: Erik Gross Age: 59 y.o. Sex: male Date of Birth: 06/21/1955 MRN: [id number]. Admitting Physician: Louellen Molder, MD GXQ:JJHERD,EYCX F., MD  Admit Date: 04/15/2014 Discharge date: 04/16/2014  Recommendations for Outpatient Follow-up:  1. Will need continued follow-up with radiation oncology and then with neurosurgery 2. Please check CBC and chemistries in the next few weeks.  PRIMARY DISCHARGE DIAGNOSIS:  Active Problems:   Bladder cancer   Brain metastases   Headache   Transitional cell carcinoma of bladder   Metastatic cancer to brain      PAST MEDICAL HISTORY: Past Medical History  Diagnosis Date  . Bladder tumor   . Nocturia   . Arthritis   . Wears glasses   . Cancer     DISCHARGE MEDICATIONS: Current Discharge Medication List    CONTINUE these medications which have CHANGED   Details  dexamethasone (DECADRON) 4 MG tablet Take 1 tablet (4 mg total) by mouth every 6 (six) hours. Qty: 60 tablet, Refills: 0      CONTINUE these medications which have NOT CHANGED   Details  acetaminophen (TYLENOL) 500 MG tablet Take 500 mg by mouth every 4 (four) hours as needed for mild pain or headache.    Associated Diagnoses: Malignant neoplasm of urinary bladder, unspecified site; Iron deficiency anemia due to chronic blood loss    etodolac (LODINE) 400 MG tablet Take 400 mg by mouth 2 (two) times daily.    Multiple Vitamin (MULTIVITAMIN) tablet Take 1 tablet by mouth daily.    bisacodyl (STIMULANT LAXATIVE) 5 MG EC tablet Take 10 mg by mouth daily as needed for mild constipation.    Associated Diagnoses: Malignant neoplasm of urinary bladder, unspecified site; Iron deficiency anemia due to chronic blood loss    calcium carbonate (TUMS - DOSED IN MG ELEMENTAL CALCIUM) 500 MG chewable tablet Chew 1 tablet by mouth daily.   Associated Diagnoses: Malignant neoplasm of urinary bladder, unspecified site; Iron deficiency anemia due to chronic blood loss     docusate sodium (COLACE) 100 MG capsule Take 1 capsule (100 mg total) by mouth 2 (two) times daily as needed (take to keep stool soft.). Qty: 60 capsule, Refills: 0    lidocaine-prilocaine (EMLA) cream Apply 1 application topically as needed. Apply to port on the day of chemotherapy. Qty: 30 g, Refills: 0   Associated Diagnoses: Malignant neoplasm of urinary bladder, unspecified site; Iron deficiency anemia due to chronic blood loss    LORazepam (ATIVAN) 1 MG tablet Take 1 tablet (1 mg total) by mouth every 6 (six) hours as needed for anxiety. Qty: 30 tablet, Refills: 0    ondansetron (ZOFRAN) 8 MG tablet Take 1 tablet (8 mg total) by mouth every 8 (eight) hours as needed for nausea or vomiting. Qty: 30 tablet, Refills: 1   Associated Diagnoses: Malignant neoplasm of urinary bladder, unspecified site    oxyCODONE (ROXICODONE) 5 MG immediate release tablet Take 1 tablet (5 mg total) by mouth every 4 (four) hours as needed. Qty: 15 tablet, Refills: 0    polyethylene glycol (MIRALAX / GLYCOLAX) packet Take 17 g by mouth daily.    prochlorperazine (COMPAZINE) 10 MG tablet Take 1 tablet (10 mg total) by mouth every 6 (six) hours as needed for nausea or vomiting. Qty: 30 tablet, Refills: 1   Associated Diagnoses: Malignant neoplasm of urinary bladder, unspecified site    TOVIAZ 4 MG TB24 tablet Take 4 mg by mouth daily. Refills: 3    TURMERIC PO Take 1  tablet by mouth daily.    Wheat Dextrin (BENEFIBER DRINK MIX PO) Take 1 packet by mouth daily.    Associated Diagnoses: Malignant neoplasm of urinary bladder, unspecified site; Iron deficiency anemia due to chronic blood loss    zolpidem (AMBIEN) 5 MG tablet Take 1 tablet (5 mg total) by mouth at bedtime as needed for sleep. Qty: 30 tablet, Refills: 0      STOP taking these medications     Influenza Vac Split Quad (FLUZONE) 0.25 ML injection         ALLERGIES:   Allergies  Allergen Reactions  . Emend [Fosaprepitant]  Shortness Of Breath and Other (See Comments)    Bright red face.    BRIEF HPI:  See H&P, Labs, Consult and Test reports for all details in brief, patient is a 59 year old male with a history of bladder cancer, who was admitted for evaluation of persistent headache, MRI was positive for brain metastases. Patient was admitted for further evaluation and treatment  CONSULTATIONS:   Neurosurgery and radiation oncology  PERTINENT RADIOLOGIC STUDIES: Ct Chest W Contrast  03/22/2014   CLINICAL DATA:  Followup bladder cancer  EXAM: CT CHEST, ABDOMEN, AND PELVIS WITH CONTRAST  TECHNIQUE: Multidetector CT imaging of the chest, abdomen and pelvis was performed following the standard protocol during bolus administration of intravenous contrast.  CONTRAST:  163mL OMNIPAQUE IOHEXOL 300 MG/ML  SOLN  COMPARISON:  CT chest from 03/09/2013 and CT abdomen pelvis from 12/14/13  FINDINGS: CT CHEST FINDINGS  Mediastinum: The heart size appears within normal limits. There is no pericardial effusion identified. No mediastinal or hilar adenopathy identified. Calcified atherosclerotic plaque involves the thoracic aorta.  Lungs/Pleura: No pleural effusion identified. There is no airspace consolidation identified. No suspicious pulmonary nodule or mass.  Musculoskeletal: There is no aggressive lytic or sclerotic bone lesions identified.  CT ABDOMEN AND PELVIS FINDINGS  Hepatobiliary: The multifocal low-attenuation foci within the liver are identified and appear similar to previous exam most likely representing simple cysts. The appearance is stable. No suspicious liver abnormality. The gallbladder is normal. No biliary dilatation.  Pancreas: Normal appearance of the pancreas.  Spleen: Calcified splenic granulomas identified.  Adrenals/Urinary Tract: The adrenal glands both appear normal. Unremarkable appearance of the kidneys. Left bladder wall has a maximum thickness of 0.9 cm, image 76/ series 2. This is compared with 3.3 cm  previously. Soft tissue nodule at the bladder base is no longer visualized.  Stomach/Bowel: The stomach is normal. The small bowel loops have a normal course and caliber without evidence for bowel obstruction. The appendix is visualized and appears normal. Extensive diverticular change involves the sigmoid colon.  Vascular/Lymphatic: Atherosclerotic disease involves the abdominal aorta. No aneurysm. No retroperitoneal or mesenteric adenopathy identified. There is no pelvic or inguinal adenopathy noted.  Reproductive: The prostate gland is unremarkable.  Other: No free fluid or fluid collections identified. There is a periumbilical hernia which contains fat only.  Musculoskeletal: Mild scoliosis deformity involves the lumbar spine which is convex towards the left. No aggressive lytic or sclerotic bone lesions identified.  IMPRESSION:  1. Significant decrease in size of enhancing mass involving the left lateral aspect of the urinary bladder. 2. No new findings and no evidence for metastatic disease. 3. Similar appearance of liver cysts. 4. Atherosclerotic disease   Electronically Signed   By: Kerby Moors M.D.   On: 03/22/2014 16:20   Ct Abdomen Pelvis W Contrast  03/22/2014   CLINICAL DATA:  Followup bladder cancer  EXAM: CT  CHEST, ABDOMEN, AND PELVIS WITH CONTRAST  TECHNIQUE: Multidetector CT imaging of the chest, abdomen and pelvis was performed following the standard protocol during bolus administration of intravenous contrast.  CONTRAST:  17mL OMNIPAQUE IOHEXOL 300 MG/ML  SOLN  COMPARISON:  CT chest from 03/09/2013 and CT abdomen pelvis from 12/14/13  FINDINGS: CT CHEST FINDINGS  Mediastinum: The heart size appears within normal limits. There is no pericardial effusion identified. No mediastinal or hilar adenopathy identified. Calcified atherosclerotic plaque involves the thoracic aorta.  Lungs/Pleura: No pleural effusion identified. There is no airspace consolidation identified. No suspicious pulmonary  nodule or mass.  Musculoskeletal: There is no aggressive lytic or sclerotic bone lesions identified.  CT ABDOMEN AND PELVIS FINDINGS  Hepatobiliary: The multifocal low-attenuation foci within the liver are identified and appear similar to previous exam most likely representing simple cysts. The appearance is stable. No suspicious liver abnormality. The gallbladder is normal. No biliary dilatation.  Pancreas: Normal appearance of the pancreas.  Spleen: Calcified splenic granulomas identified.  Adrenals/Urinary Tract: The adrenal glands both appear normal. Unremarkable appearance of the kidneys. Left bladder wall has a maximum thickness of 0.9 cm, image 76/ series 2. This is compared with 3.3 cm previously. Soft tissue nodule at the bladder base is no longer visualized.  Stomach/Bowel: The stomach is normal. The small bowel loops have a normal course and caliber without evidence for bowel obstruction. The appendix is visualized and appears normal. Extensive diverticular change involves the sigmoid colon.  Vascular/Lymphatic: Atherosclerotic disease involves the abdominal aorta. No aneurysm. No retroperitoneal or mesenteric adenopathy identified. There is no pelvic or inguinal adenopathy noted.  Reproductive: The prostate gland is unremarkable.  Other: No free fluid or fluid collections identified. There is a periumbilical hernia which contains fat only.  Musculoskeletal: Mild scoliosis deformity involves the lumbar spine which is convex towards the left. No aggressive lytic or sclerotic bone lesions identified.  IMPRESSION:  1. Significant decrease in size of enhancing mass involving the left lateral aspect of the urinary bladder. 2. No new findings and no evidence for metastatic disease. 3. Similar appearance of liver cysts. 4. Atherosclerotic disease   Electronically Signed   By: Kerby Moors M.D.   On: 03/22/2014 16:20     PERTINENT LAB RESULTS: CBC:  Recent Labs  04/15/14 0957  WBC 8.5  HGB 12.2*    HCT 37.7*  PLT 205   CMET CMP     Component Value Date/Time   NA 139 04/16/2014 0445   NA 137 03/22/2014 1159   K 4.5 04/16/2014 0445   K 4.8 03/22/2014 1159   CL 106 04/16/2014 0445   CO2 27 04/16/2014 0445   CO2 28 03/22/2014 1159   GLUCOSE 138* 04/16/2014 0445   GLUCOSE 88 03/22/2014 1159   BUN 12 04/16/2014 0445   BUN 14.7 03/22/2014 1159   CREATININE 1.04 04/16/2014 0445   CREATININE 1.0 03/22/2014 1159   CALCIUM 9.7 04/16/2014 0445   CALCIUM 9.9 03/22/2014 1159   PROT 6.4 04/15/2014 0957   PROT 6.9 03/22/2014 1159   ALBUMIN 3.6 04/15/2014 0957   ALBUMIN 3.9 03/22/2014 1159   AST 20 04/15/2014 0957   AST 20 03/22/2014 1159   ALT 11 04/15/2014 0957   ALT 18 03/22/2014 1159   ALKPHOS 47 04/15/2014 0957   ALKPHOS 64 03/22/2014 1159   BILITOT 0.7 04/15/2014 0957   BILITOT 0.46 03/22/2014 1159   GFRNONAA 76* 04/16/2014 0445   GFRAA 88* 04/16/2014 0445    GFR Estimated Creatinine Clearance:  79.7 mL/min (by C-G formula based on Cr of 1.04). No results for input(s): LIPASE, AMYLASE in the last 72 hours. No results for input(s): CKTOTAL, CKMB, CKMBINDEX, TROPONINI in the last 72 hours. Invalid input(s): POCBNP No results for input(s): DDIMER in the last 72 hours. No results for input(s): HGBA1C in the last 72 hours. No results for input(s): CHOL, HDL, LDLCALC, TRIG, CHOLHDL, LDLDIRECT in the last 72 hours. No results for input(s): TSH, T4TOTAL, T3FREE, THYROIDAB in the last 72 hours.  Invalid input(s): FREET3 No results for input(s): VITAMINB12, FOLATE, FERRITIN, TIBC, IRON, RETICCTPCT in the last 72 hours. Coags: No results for input(s): INR in the last 72 hours.  Invalid input(s): PT Microbiology: No results found for this or any previous visit (from the past 240 hour(s)).   BRIEF HOSPITAL COURSE:   Active Problems:   Brain metastases: Likely causing headaches he did patient was seen in consult by both neurosurgery and radiation oncology-Dr. Tammi Klippel.  Patient was started on Decadron with good results, he currently does not have any headaches. He was subsequently transferred to [facility] radiation center where he had [facility] CT Simulation. I spoke with the radiation oncologist-Dr. Tammi Klippel, who indicates that the patient is stable for discharge on oral Decadron. Patient will start [place] treatment on 04/20/14, he will be tapered off Decadron per radiation oncology/neurosurgery. He is currently stable, without headaches, and with no functional deficits.    Headache: Resolved with Decadron. Continue other supportive care. Please see above.    Transitional cell bladder carcinoma Continued management per oncology.  TODAY-DAY OF DISCHARGE:  Subjective:   Erik Gross today has no headache,no chest abdominal pain,no new weakness tingling or numbness, feels much better wants to go home today.   Objective:   Blood pressure 124/85, pulse 104, temperature 97.8 F (36.6 C), temperature source Oral, resp. rate 16, height 5\' 6"  (1.676 m), weight 90.719 kg (200 lb), SpO2 98 %.  Intake/Output Summary (Last 24 hours) at 04/16/14 1205 Last data filed at 04/16/14 0550  Gross per 24 hour  Intake 1002.16 ml  Output      0 ml  Net 1002.16 ml   Filed Weights   04/15/14 0933  Weight: 90.719 kg (200 lb)    Exam Awake Alert, Oriented *3, No new F.N deficits, Normal affect [place].AT,PERRAL Supple Neck,No JVD, No cervical lymphadenopathy appriciated.  Symmetrical Chest wall movement, Good air movement bilaterally, CTAB RRR,No Gallops,Rubs or new Murmurs, No Parasternal Heave +ve B.Sounds, Abd Soft, Non tender, No organomegaly appriciated, No rebound -guarding or rigidity. No Cyanosis, Clubbing or edema, No new Rash or bruise  DISCHARGE CONDITION: Stable  DISPOSITION: Home  DISCHARGE INSTRUCTIONS:    Activity:  As tolerated  Diet recommendation: Regular Diet   Discharge Instructions    Call MD for:  persistant nausea and vomiting    Complete by:  As  directed      Call MD for:  severe uncontrolled pain    Complete by:  As directed      Diet - low sodium heart healthy    Complete by:  As directed      Increase activity slowly    Complete by:  As directed            Follow-up Information    Follow up with Rusty Aus., MD. Schedule an appointment as soon as possible for a visit in 1 week.   Specialty:  Internal Medicine   Contact information:   [address] [place] [address]  [phone]       Follow up with Lora Paula, MD On 04/20/2014.   Specialty:  Radiation Oncology   Why:  appt at 3:30 pm   Contact information:   [place] [address] [phone]      Total Time spent on discharge equals 45 minutes.  SignedOren Binet 04/16/2014 12:05 PM

## 2014-04-16 NOTE — Progress Notes (Signed)
Patient discharged to home with instructions. 

## 2014-04-19 ENCOUNTER — Telehealth: Payer: Self-pay | Admitting: Radiation Oncology

## 2014-04-19 ENCOUNTER — Other Ambulatory Visit: Payer: Self-pay | Admitting: Radiation Oncology

## 2014-04-19 DIAGNOSIS — D500 Iron deficiency anemia secondary to blood loss (chronic): Secondary | ICD-10-CM

## 2014-04-19 DIAGNOSIS — C679 Malignant neoplasm of bladder, unspecified: Secondary | ICD-10-CM

## 2014-04-19 MED ORDER — CHLORPROMAZINE HCL 25 MG PO TABS: 25.0000 mg | ORAL_TABLET | Freq: Three times a day (TID) | ORAL | Status: DC | PRN

## 2014-04-19 MED ORDER — LORAZEPAM 1 MG PO TABS: 1.0000 mg | ORAL_TABLET | Freq: Four times a day (QID) | ORAL | Status: DC | PRN

## 2014-04-19 NOTE — Telephone Encounter (Signed)
Opened in error

## 2014-04-19 NOTE — Telephone Encounter (Signed)
Thorazine escribed. Called in Ativan 1 mg per Dr. Johny Shears order to Linna Hoff, pharmacist. Then, phoned patient's wife, [name], informing her this was done.

## 2014-04-20 ENCOUNTER — Encounter: Payer: Self-pay | Admitting: Radiation Oncology

## 2014-04-20 ENCOUNTER — Encounter (HOSPITAL_COMMUNITY): Payer: Self-pay | Admitting: *Deleted

## 2014-04-20 ENCOUNTER — Ambulatory Visit
Admit: 2014-04-20 | Discharge: 2014-04-20 | Disposition: A | Discharge status: Home / Self Care | Payer: BLUE CROSS/BLUE SHIELD | Attending: Radiation Oncology | Admitting: Radiation Oncology

## 2014-04-20 ENCOUNTER — Ambulatory Visit (HOSPITAL_COMMUNITY)
Admission: RE | Admit: 2014-04-20 | Discharge: 2014-04-20 | Disposition: A | Discharge status: Home / Self Care | Payer: BLUE CROSS/BLUE SHIELD | Source: Ambulatory Visit | Attending: Radiation Oncology | Admitting: Radiation Oncology

## 2014-04-20 ENCOUNTER — Other Ambulatory Visit: Payer: Self-pay | Admitting: Radiation Therapy

## 2014-04-20 ENCOUNTER — Inpatient Hospital Stay (HOSPITAL_COMMUNITY)
Admission: RE | Admit: 2014-04-20 | Discharge: 2014-04-24 | DRG: 027 | Disposition: A | Discharge status: Home / Self Care | Payer: BLUE CROSS/BLUE SHIELD | Source: Ambulatory Visit | Attending: Neurological Surgery | Admitting: Neurological Surgery

## 2014-04-20 VITALS — BP 127/91 | HR 94 | Temp 97.5°F | Resp 16

## 2014-04-20 DIAGNOSIS — C7931 Secondary malignant neoplasm of brain: Secondary | ICD-10-CM

## 2014-04-20 DIAGNOSIS — C679 Malignant neoplasm of bladder, unspecified: Secondary | ICD-10-CM

## 2014-04-20 DIAGNOSIS — Z9889 Other specified postprocedural states: Secondary | ICD-10-CM

## 2014-04-20 DIAGNOSIS — Z452 Encounter for adjustment and management of vascular access device: Secondary | ICD-10-CM

## 2014-04-20 DIAGNOSIS — R51 Headache: Secondary | ICD-10-CM

## 2014-04-20 DIAGNOSIS — D500 Iron deficiency anemia secondary to blood loss (chronic): Secondary | ICD-10-CM | POA: Diagnosis not present

## 2014-04-20 DIAGNOSIS — D496 Neoplasm of unspecified behavior of brain: Secondary | ICD-10-CM

## 2014-04-20 DIAGNOSIS — Z510 Encounter for antineoplastic radiation therapy: Secondary | ICD-10-CM | POA: Diagnosis not present

## 2014-04-20 DIAGNOSIS — B370 Candidal stomatitis: Secondary | ICD-10-CM | POA: Diagnosis not present

## 2014-04-20 HISTORY — DX: Depression, unspecified: F32.A

## 2014-04-20 HISTORY — DX: Personal history of other medical treatment: Z92.89

## 2014-04-20 HISTORY — DX: Hiccough: R06.6

## 2014-04-20 HISTORY — DX: Neoplasm of unspecified behavior of brain: D49.6

## 2014-04-20 HISTORY — DX: Anxiety disorder, unspecified: F41.9

## 2014-04-20 HISTORY — DX: Anemia, unspecified: D64.9

## 2014-04-20 HISTORY — DX: Major depressive disorder, single episode, unspecified: F32.9

## 2014-04-20 MED ORDER — CEFAZOLIN SODIUM-DEXTROSE 2-3 GM-% IV SOLR
2.0000 g | INTRAVENOUS | Status: AC
  Administered 2014-04-21: 2 g via INTRAVENOUS

## 2014-04-20 MED ORDER — DEXAMETHASONE SODIUM PHOSPHATE 10 MG/ML IJ SOLN
10.0000 mg | INTRAMUSCULAR | Status: AC
  Administered 2014-04-21: 10 mg via INTRAVENOUS

## 2014-04-20 MED ORDER — GADOBENATE DIMEGLUMINE 529 MG/ML IV SOLN
20.0000 mL | Freq: Once | INTRAVENOUS | Status: AC | PRN
  Administered 2014-04-20: 17 mL via INTRAVENOUS

## 2014-04-20 NOTE — Progress Notes (Signed)
Nurse monitoring complete. Patient without complaints of headache, dizziness, nausea, diplopia or ringing in the ears. Patient taking decadron 4 mg every eight hours and understands to continue this until otherwise directed by his physician. Ambulated with the patient to the lobby. Patient discharged home with wife. Both understand to contact staff with needs. Manuela Schwartz [facility] navigator, to contact patient with one month follow up appointment.

## 2014-04-20 NOTE — Progress Notes (Signed)
   04/20/14 1149  OBSTRUCTIVE SLEEP APNEA  Have you ever been diagnosed with sleep apnea through a sleep study? No  Do you snore loudly (loud enough to be heard through closed doors)?  1  Do you often feel tired, fatigued, or sleepy during the daytime? 0  Has anyone observed you stop breathing during your sleep? 0  Do you have, or are you being treated for high blood pressure? 0  BMI more than 35 kg/m2? 0  Age over 61 years old? 1  Neck circumference greater than 40 cm/16 inches? 0  Gender: 1  Obstructive Sleep Apnea Score 3

## 2014-04-20 NOTE — Progress Notes (Signed)
Received patient in nursing following srs treatment. Patient accompanied by his wife. Patient alert and oriented to person, place, and time. Patient ambulatory. Denies pain. Denies headache, dizziness, nausea, diplopia or ringing in the ears.

## 2014-04-20 NOTE — Progress Notes (Signed)
  Radiation Oncology         [phone]) [phone] ________________________________  Stereotactic Treatment Procedure Note  Name: Erik Gross MRN: [id number]  Date: 04/20/2014  DOB: 1955/11/05  SPECIAL TREATMENT PROCEDURE    ICD-9-CM ICD-10-CM   1. Brain metastases 198.3 C79.31     3D TREATMENT PLANNING AND DOSIMETRY:  The patient's radiation plan was reviewed and approved by neurosurgery and radiation oncology prior to treatment.  It showed 3-dimensional radiation distributions overlaid onto the planning CT/MRI image set.  The [facility] for the target structures as well as the organs at risk were reviewed. The documentation of the 3D plan and dosimetry are filed in the radiation oncology EMR.  NARRATIVE:  Erik Gross was brought to the TrueBeam stereotactic radiation treatment machine and placed supine on the CT couch. The head frame was applied, and the patient was set up for stereotactic radiosurgery.  Neurosurgery was present for the set-up and delivery  SIMULATION VERIFICATION:  In the couch zero-angle position, the patient underwent Exactrac imaging using the Brainlab system with orthogonal KV images.  These were carefully aligned and repeated to confirm treatment position for each of the isocenters.  The Exactrac snap film verification was repeated at each couch angle.  SPECIAL TREATMENT PROCEDURE: Erik Gross received stereotactic radiosurgery to the following targets: Right temporal 51 mm target was treated using 4 Rapid Arc VMAT Beams to a prescription dose of 14 Gy.  ExacTrac registration was performed for each couch angle.  The 100% isodose line was prescribed.  6 MV X-rays were delivered in the flattening filter free beam mode. Left Parietal 18 mm target was treated using 4 Rapid Arc VMAT Beams to a prescription dose of 20 Gy.  ExacTrac registration was performed for each couch angle.  The 100% isodose line was prescribed.  6 MV X-rays were delivered in the flattening filter free beam  mode. Left Cerebellar 14 mm target was treated using 4 Rapid Arc VMAT Beams to a prescription dose of 20 Gy.  ExacTrac registration was performed for each couch angle.  The 100% isodose line was prescribed.  6 MV X-rays were delivered in the flattening filter free beam mode. The 4 VMAT arcs were delivered to a single isocenter.  STEREOTACTIC TREATMENT MANAGEMENT:  Following delivery, the patient was transported to nursing in stable condition and monitored for possible acute effects.  Vital signs were recorded BP 127/91 mmHg  Pulse 94  Temp(Src) 97.5 F (36.4 C) (Oral)  Resp 16  SpO2 98%. The patient tolerated treatment without significant acute effects, and was discharged to home in stable condition.    PLAN: Follow-up in one month.  ________________________________  Sheral Apley. Tammi Klippel, M.D.

## 2014-04-20 NOTE — Progress Notes (Signed)
  Name: Erik Gross  MRN: [id number]  Date: 04/20/2014   DOB: [birth year]  Stereotactic Radiosurgery Operative Note  PRE-OPERATIVE DIAGNOSIS:  Multiple Brain Metastases  POST-OPERATIVE DIAGNOSIS:  Multiple Brain Metastases  PROCEDURE:  Stereotactic Radiosurgery  SURGEON:  Eustace Moore, MD  NARRATIVE: The patient underwent a radiation treatment planning session in the radiation oncology simulation suite under the care of the radiation oncology physician and physicist.  I participated closely in the radiation treatment planning afterwards. The patient underwent planning CT which was fused to 3T high resolution MRI with 1 mm axial slices.  These images were fused on the planning system.  We contoured the gross target volumes and subsequently expanded this to yield the Planning Target Volume. I actively participated in the planning process.  I helped to define and review the target contours and also the contours of the optic pathway, eyes, brainstem and selected nearby organs at risk.  All the dose constraints for critical structures were reviewed and compared to AAPM Task Group 101.  The prescription dose conformity was reviewed.  I approved the plan electronically.    Accordingly, Erik Gross was brought to the TrueBeam stereotactic radiation treatment linac and placed in the custom immobilization mask.  The patient was aligned according to the IR fiducial markers with BrainLab Exactrac, then orthogonal x-rays were used in ExacTrac with the 6DOF robotic table and the shifts were made to align the patient  Erik Gross received stereotactic radiosurgery uneventfully.    Lesions treated:  3   Complex lesions treated:  1 (>3.5 cm, <61mm of optic path, or within the brainstem)   The detailed description of the procedure is recorded in the radiation oncology procedure note.  I was present for the duration of the procedure.  DISPOSITION:  Following delivery, the patient was transported to nursing in  stable condition and monitored for possible acute effects to be discharged to home in stable condition with follow-up in one month.  Eustace Moore, MD 04/20/2014 4:40 PM

## 2014-04-21 ENCOUNTER — Inpatient Hospital Stay (HOSPITAL_COMMUNITY): Payer: BLUE CROSS/BLUE SHIELD

## 2014-04-21 ENCOUNTER — Encounter (HOSPITAL_COMMUNITY): Payer: Self-pay | Admitting: Anesthesiology

## 2014-04-21 ENCOUNTER — Telehealth: Payer: Self-pay | Admitting: Oncology

## 2014-04-21 ENCOUNTER — Inpatient Hospital Stay (HOSPITAL_COMMUNITY): Payer: BLUE CROSS/BLUE SHIELD | Admitting: Anesthesiology

## 2014-04-21 ENCOUNTER — Encounter (HOSPITAL_COMMUNITY)
Admission: RE | Disposition: A | Discharge status: Home / Self Care | Payer: Self-pay | Source: Ambulatory Visit | Attending: Neurological Surgery

## 2014-04-21 DIAGNOSIS — Z87891 Personal history of nicotine dependence: Secondary | ICD-10-CM

## 2014-04-21 DIAGNOSIS — C7931 Secondary malignant neoplasm of brain: Secondary | ICD-10-CM | POA: Diagnosis present

## 2014-04-21 DIAGNOSIS — C679 Malignant neoplasm of bladder, unspecified: Secondary | ICD-10-CM | POA: Diagnosis present

## 2014-04-21 DIAGNOSIS — Z9889 Other specified postprocedural states: Secondary | ICD-10-CM

## 2014-04-21 HISTORY — PX: CRANIOTOMY: SHX93

## 2014-04-21 LAB — CBC WITH DIFFERENTIAL/PLATELET
BASOS ABS: 0 10*3/uL (ref 0.0–0.1)
Basophils Relative: 0 % (ref 0–1)
EOS PCT: 0 % (ref 0–5)
Eosinophils Absolute: 0 10*3/uL (ref 0.0–0.7)
HEMATOCRIT: 41.7 % (ref 39.0–52.0)
Hemoglobin: 13.8 g/dL (ref 13.0–17.0)
Lymphocytes Relative: 5 % — ABNORMAL LOW (ref 12–46)
Lymphs Abs: 0.7 10*3/uL (ref 0.7–4.0)
MCH: 31.2 pg (ref 26.0–34.0)
MCHC: 33.1 g/dL (ref 30.0–36.0)
MCV: 94.3 fL (ref 78.0–100.0)
MONO ABS: 0.6 10*3/uL (ref 0.1–1.0)
Monocytes Relative: 5 % (ref 3–12)
Neutro Abs: 12.3 10*3/uL — ABNORMAL HIGH (ref 1.7–7.7)
Neutrophils Relative %: 90 % — ABNORMAL HIGH (ref 43–77)
PLATELETS: 216 10*3/uL (ref 150–400)
RBC: 4.42 MIL/uL (ref 4.22–5.81)
RDW: 17.4 % — ABNORMAL HIGH (ref 11.5–15.5)
WBC: 13.6 10*3/uL — AB (ref 4.0–10.5)

## 2014-04-21 LAB — BASIC METABOLIC PANEL
ANION GAP: 11 (ref 5–15)
BUN: 24 mg/dL — AB (ref 6–23)
CO2: 23 mmol/L (ref 19–32)
Calcium: 9.2 mg/dL (ref 8.4–10.5)
Chloride: 104 mmol/L (ref 96–112)
Creatinine, Ser: 1.01 mg/dL (ref 0.50–1.35)
GFR, EST NON AFRICAN AMERICAN: 79 mL/min — AB (ref >90)
Glucose, Bld: 99 mg/dL (ref 70–99)
Potassium: 4.2 mmol/L (ref 3.5–5.1)
SODIUM: 138 mmol/L (ref 135–145)

## 2014-04-21 LAB — TYPE AND SCREEN
ABO/RH(D): O POS
Antibody Screen: NEGATIVE

## 2014-04-21 LAB — PROTIME-INR
INR: 1.02 (ref 0.00–1.49)
PROTHROMBIN TIME: 13.5 s (ref 11.6–15.2)

## 2014-04-21 SURGERY — CRANIOTOMY TUMOR EXCISION
Anesthesia: General | Laterality: Right

## 2014-04-21 MED ORDER — ACETAMINOPHEN 650 MG RE SUPP: 650.0000 mg | RECTAL | Status: DC | PRN

## 2014-04-21 MED ORDER — ONDANSETRON HCL 4 MG/2ML IJ SOLN: 4.0000 mg | INTRAMUSCULAR | Status: DC | PRN

## 2014-04-21 MED ORDER — FENTANYL CITRATE 0.05 MG/ML IJ SOLN
INTRAMUSCULAR | Status: AC
  Filled 2014-04-21: qty 5

## 2014-04-21 MED ORDER — FESOTERODINE FUMARATE ER 4 MG PO TB24
4.0000 mg | ORAL_TABLET | Freq: Every day | ORAL | Status: DC
Start: 2014-04-21 — End: 2014-04-21

## 2014-04-21 MED ORDER — OXYCODONE HCL 5 MG/5ML PO SOLN: 5.0000 mg | Freq: Once | ORAL | Status: DC | PRN

## 2014-04-21 MED ORDER — ROCURONIUM BROMIDE 100 MG/10ML IV SOLN
INTRAVENOUS | Status: DC | PRN
  Administered 2014-04-21: 50 mg via INTRAVENOUS

## 2014-04-21 MED ORDER — FENTANYL CITRATE 0.05 MG/ML IJ SOLN
INTRAMUSCULAR | Status: AC
  Administered 2014-04-21: 25 ug via INTRAVENOUS
  Filled 2014-04-21: qty 2

## 2014-04-21 MED ORDER — ARTIFICIAL TEARS OP OINT
TOPICAL_OINTMENT | OPHTHALMIC | Status: DC | PRN
  Administered 2014-04-21: 1 via OPHTHALMIC

## 2014-04-21 MED ORDER — DEXAMETHASONE SODIUM PHOSPHATE 4 MG/ML IJ SOLN: 4.0000 mg | Freq: Three times a day (TID) | INTRAMUSCULAR | Status: DC

## 2014-04-21 MED ORDER — PROMETHAZINE HCL 25 MG PO TABS: 12.5000 mg | ORAL_TABLET | ORAL | Status: DC | PRN

## 2014-04-21 MED ORDER — SENNA 8.6 MG PO TABS
1.0000 | ORAL_TABLET | Freq: Two times a day (BID) | ORAL | Status: DC
  Administered 2014-04-21 – 2014-04-22 (×2): 8.6 mg via ORAL
  Filled 2014-04-21 (×7): qty 1

## 2014-04-21 MED ORDER — ONDANSETRON HCL 4 MG PO TABS: 4.0000 mg | ORAL_TABLET | ORAL | Status: DC | PRN

## 2014-04-21 MED ORDER — LACTATED RINGERS IV SOLN: INTRAVENOUS | Status: DC | PRN

## 2014-04-21 MED ORDER — MICROFIBRILLAR COLL HEMOSTAT EX PADS
MEDICATED_PAD | CUTANEOUS | Status: DC | PRN
  Administered 2014-04-21: 1 via TOPICAL

## 2014-04-21 MED ORDER — PANTOPRAZOLE SODIUM 40 MG IV SOLR
40.0000 mg | Freq: Every day | INTRAVENOUS | Status: DC
  Administered 2014-04-21 – 2014-04-22 (×2): 40 mg via INTRAVENOUS
  Filled 2014-04-21 (×3): qty 40

## 2014-04-21 MED ORDER — DEXAMETHASONE SODIUM PHOSPHATE 4 MG/ML IJ SOLN
4.0000 mg | Freq: Four times a day (QID) | INTRAMUSCULAR | Status: DC
Start: 2014-04-22 — End: 2014-04-22
  Filled 2014-04-21 (×3): qty 1

## 2014-04-21 MED ORDER — LACTATED RINGERS IV SOLN
INTRAVENOUS | Status: DC | PRN
  Administered 2014-04-21: 12:00:00 via INTRAVENOUS

## 2014-04-21 MED ORDER — MORPHINE SULFATE 2 MG/ML IJ SOLN
INTRAMUSCULAR | Status: AC
  Administered 2014-04-21: 1 mg via INTRAVENOUS
  Filled 2014-04-21: qty 1

## 2014-04-21 MED ORDER — SODIUM CHLORIDE 0.9 % IV SOLN
INTRAVENOUS | Status: DC | PRN
  Administered 2014-04-21 (×2): via INTRAVENOUS

## 2014-04-21 MED ORDER — BACITRACIN ZINC 500 UNIT/GM EX OINT
TOPICAL_OINTMENT | CUTANEOUS | Status: DC | PRN
  Administered 2014-04-21: 1 via TOPICAL

## 2014-04-21 MED ORDER — THROMBIN 5000 UNITS EX SOLR
OROMUCOSAL | Status: DC | PRN
  Administered 2014-04-21: 14:00:00 via TOPICAL

## 2014-04-21 MED ORDER — PROPOFOL 10 MG/ML IV BOLUS
INTRAVENOUS | Status: AC
  Filled 2014-04-21: qty 20

## 2014-04-21 MED ORDER — FENTANYL CITRATE 0.05 MG/ML IJ SOLN
25.0000 ug | INTRAMUSCULAR | Status: DC | PRN
  Administered 2014-04-21 (×3): 25 ug via INTRAVENOUS

## 2014-04-21 MED ORDER — NEOSTIGMINE METHYLSULFATE 10 MG/10ML IV SOLN
INTRAVENOUS | Status: DC | PRN
  Administered 2014-04-21: 4 mg via INTRAVENOUS

## 2014-04-21 MED ORDER — MIDAZOLAM HCL 5 MG/5ML IJ SOLN
INTRAMUSCULAR | Status: DC | PRN
  Administered 2014-04-21 (×2): 1 mg via INTRAVENOUS

## 2014-04-21 MED ORDER — PROPOFOL 10 MG/ML IV BOLUS
INTRAVENOUS | Status: DC | PRN
  Administered 2014-04-21: 20 mg via INTRAVENOUS
  Administered 2014-04-21: 180 mg via INTRAVENOUS

## 2014-04-21 MED ORDER — LACTATED RINGERS IV SOLN
INTRAVENOUS | Status: DC
  Administered 2014-04-21: 12:00:00 via INTRAVENOUS

## 2014-04-21 MED ORDER — MIDAZOLAM HCL 2 MG/2ML IJ SOLN
INTRAMUSCULAR | Status: AC
  Filled 2014-04-21: qty 2

## 2014-04-21 MED ORDER — ACETAMINOPHEN 325 MG PO TABS
650.0000 mg | ORAL_TABLET | ORAL | Status: DC | PRN
  Administered 2014-04-22 – 2014-04-24 (×5): 650 mg via ORAL
  Filled 2014-04-21 (×5): qty 2

## 2014-04-21 MED ORDER — LABETALOL HCL 5 MG/ML IV SOLN
10.0000 mg | INTRAVENOUS | Status: DC | PRN
  Administered 2014-04-21: 10 mg via INTRAVENOUS

## 2014-04-21 MED ORDER — MORPHINE SULFATE 2 MG/ML IJ SOLN
1.0000 mg | INTRAMUSCULAR | Status: DC | PRN
  Administered 2014-04-21: 2 mg via INTRAVENOUS
  Administered 2014-04-21: 1 mg via INTRAVENOUS
  Administered 2014-04-21: 2 mg via INTRAVENOUS
  Administered 2014-04-22: 1 mg via INTRAVENOUS
  Administered 2014-04-22 (×2): 2 mg via INTRAVENOUS
  Filled 2014-04-21 (×5): qty 1

## 2014-04-21 MED ORDER — POTASSIUM CHLORIDE IN NACL 20-0.9 MEQ/L-% IV SOLN
INTRAVENOUS | Status: DC
  Administered 2014-04-21: 20:00:00 via INTRAVENOUS
  Filled 2014-04-21 (×3): qty 1000

## 2014-04-21 MED ORDER — SODIUM CHLORIDE 0.9 % IV SOLN
INTRAVENOUS | Status: DC | PRN
  Administered 2014-04-21: 15:00:00 via INTRAVENOUS

## 2014-04-21 MED ORDER — LABETALOL HCL 5 MG/ML IV SOLN
INTRAVENOUS | Status: DC | PRN
  Administered 2014-04-21 (×2): 10 mg via INTRAVENOUS

## 2014-04-21 MED ORDER — FENTANYL CITRATE 0.05 MG/ML IJ SOLN
INTRAMUSCULAR | Status: DC | PRN
  Administered 2014-04-21: 50 ug via INTRAVENOUS
  Administered 2014-04-21 (×2): 100 ug via INTRAVENOUS
  Administered 2014-04-21: 50 ug via INTRAVENOUS

## 2014-04-21 MED ORDER — LIDOCAINE HCL (CARDIAC) 20 MG/ML IV SOLN
INTRAVENOUS | Status: DC | PRN
  Administered 2014-04-21: 100 mg via INTRAVENOUS

## 2014-04-21 MED ORDER — OXYCODONE HCL 5 MG PO TABS: 5.0000 mg | ORAL_TABLET | Freq: Once | ORAL | Status: DC | PRN

## 2014-04-21 MED ORDER — LIDOCAINE-EPINEPHRINE 1 %-1:100000 IJ SOLN
INTRAMUSCULAR | Status: DC | PRN
  Administered 2014-04-21: 9 mL

## 2014-04-21 MED ORDER — ONDANSETRON HCL 4 MG/2ML IJ SOLN: 4.0000 mg | Freq: Four times a day (QID) | INTRAMUSCULAR | Status: DC | PRN

## 2014-04-21 MED ORDER — LABETALOL HCL 5 MG/ML IV SOLN
INTRAVENOUS | Status: AC
  Filled 2014-04-21: qty 4

## 2014-04-21 MED ORDER — THROMBIN 20000 UNITS EX SOLR
CUTANEOUS | Status: DC | PRN
  Administered 2014-04-21: 14:00:00 via TOPICAL

## 2014-04-21 MED ORDER — MICROFIBRILLAR COLL HEMOSTAT EX PADS: MEDICATED_PAD | CUTANEOUS | Status: DC | PRN

## 2014-04-21 MED ORDER — DEXAMETHASONE SODIUM PHOSPHATE 10 MG/ML IJ SOLN
6.0000 mg | Freq: Four times a day (QID) | INTRAMUSCULAR | Status: AC
  Administered 2014-04-21 – 2014-04-22 (×4): 6 mg via INTRAVENOUS
  Filled 2014-04-21: qty 1
  Filled 2014-04-21: qty 0.6
  Filled 2014-04-21 (×2): qty 1

## 2014-04-21 MED ORDER — ONDANSETRON HCL 4 MG/2ML IJ SOLN
INTRAMUSCULAR | Status: DC | PRN
  Administered 2014-04-21: 4 mg via INTRAVENOUS

## 2014-04-21 MED ORDER — 0.9 % SODIUM CHLORIDE (POUR BTL) OPTIME
TOPICAL | Status: DC | PRN
  Administered 2014-04-21 (×2): 1000 mL

## 2014-04-21 MED ORDER — CEFAZOLIN SODIUM-DEXTROSE 2-3 GM-% IV SOLR
2.0000 g | Freq: Three times a day (TID) | INTRAVENOUS | Status: AC
  Administered 2014-04-21 – 2014-04-22 (×2): 2 g via INTRAVENOUS
  Filled 2014-04-21 (×2): qty 50

## 2014-04-21 MED ORDER — VECURONIUM BROMIDE 10 MG IV SOLR
INTRAVENOUS | Status: DC | PRN
  Administered 2014-04-21: 2 mg via INTRAVENOUS

## 2014-04-21 MED ORDER — GLYCOPYRROLATE 0.2 MG/ML IJ SOLN
INTRAMUSCULAR | Status: DC | PRN
  Administered 2014-04-21: .7 mg via INTRAVENOUS

## 2014-04-21 MED ORDER — ROCURONIUM BROMIDE 50 MG/5ML IV SOLN
INTRAVENOUS | Status: AC
  Filled 2014-04-21: qty 1

## 2014-04-21 SURGICAL SUPPLY — 61 items
BAG DECANTER FOR FLEXI CONT (MISCELLANEOUS) ×3 IMPLANT
BLADE CLIPPER SURG NEURO (BLADE) ×3 IMPLANT
BRUSH SCRUB EZ 1% IODOPHOR (MISCELLANEOUS) ×3 IMPLANT
BUR ROUTER D-58 CRANI (BURR) ×3 IMPLANT
CANISTER SUCT 3000ML (MISCELLANEOUS) ×6 IMPLANT
CLIP TI MEDIUM 6 (CLIP) IMPLANT
CONT SPEC 4OZ CLIKSEAL STRL BL (MISCELLANEOUS) ×6 IMPLANT
COVER BACK TABLE 60X90IN (DRAPES) IMPLANT
DRAPE MICROSCOPE LEICA (MISCELLANEOUS) IMPLANT
DRAPE NEUROLOGICAL W/INCISE (DRAPES) ×3 IMPLANT
DRAPE SURG 17X23 STRL (DRAPES) IMPLANT
DRAPE WARM FLUID 44X44 (DRAPE) ×3 IMPLANT
DRSG TELFA 3X8 NADH (GAUZE/BANDAGES/DRESSINGS) ×3 IMPLANT
DURAPREP 6ML APPLICATOR 50/CS (WOUND CARE) ×3 IMPLANT
ELECT REM PT RETURN 9FT ADLT (ELECTROSURGICAL) ×3
ELECTRODE REM PT RTRN 9FT ADLT (ELECTROSURGICAL) ×1 IMPLANT
EVACUATOR 1/8 PVC DRAIN (DRAIN) IMPLANT
FORCEPS BIPOLAR SPETZLER 8 1.0 (NEUROSURGERY SUPPLIES) ×3 IMPLANT
GAUZE SPONGE 4X4 12PLY STRL (GAUZE/BANDAGES/DRESSINGS) ×3 IMPLANT
GAUZE SPONGE 4X4 16PLY XRAY LF (GAUZE/BANDAGES/DRESSINGS) IMPLANT
GLOVE BIO SURGEON STRL SZ8 (GLOVE) ×3 IMPLANT
GLOVE ECLIPSE 6.5 STRL STRAW (GLOVE) ×3 IMPLANT
GLOVE ECLIPSE 7.5 STRL STRAW (GLOVE) ×3 IMPLANT
GOWN STRL REUS W/ TWL LRG LVL3 (GOWN DISPOSABLE) ×1 IMPLANT
GOWN STRL REUS W/ TWL XL LVL3 (GOWN DISPOSABLE) ×2 IMPLANT
GOWN STRL REUS W/TWL 2XL LVL3 (GOWN DISPOSABLE) IMPLANT
GOWN STRL REUS W/TWL LRG LVL3 (GOWN DISPOSABLE) ×2
GOWN STRL REUS W/TWL XL LVL3 (GOWN DISPOSABLE) ×4
HEMOSTAT POWDER KIT SURGIFOAM (HEMOSTASIS) ×3 IMPLANT
KIT BASIN OR (CUSTOM PROCEDURE TRAY) ×3 IMPLANT
KIT ROOM TURNOVER OR (KITS) ×3 IMPLANT
MARKER SPHERE PSV REFLC 13MM (MARKER) ×6 IMPLANT
NEEDLE HYPO 22GX1.5 SAFETY (NEEDLE) ×3 IMPLANT
NS IRRIG 1000ML POUR BTL (IV SOLUTION) ×6 IMPLANT
PACK CRANIOTOMY (CUSTOM PROCEDURE TRAY) ×3 IMPLANT
PAD ARMBOARD 7.5X6 YLW CONV (MISCELLANEOUS) ×3 IMPLANT
PATTIES SURGICAL .25X.25 (GAUZE/BANDAGES/DRESSINGS) IMPLANT
PATTIES SURGICAL .5 X.5 (GAUZE/BANDAGES/DRESSINGS) IMPLANT
PATTIES SURGICAL .5 X3 (DISPOSABLE) IMPLANT
PATTIES SURGICAL 1X1 (DISPOSABLE) IMPLANT
PERFORATOR LRG  14-11MM (BIT) ×2
PERFORATOR LRG 14-11MM (BIT) ×1 IMPLANT
PIN MAYFIELD SKULL DISP (PIN) ×3 IMPLANT
PLATE 1.5  2HOLE LNG NEURO (Plate) ×6 IMPLANT
PLATE 1.5 2HOLE LNG NEURO (Plate) ×3 IMPLANT
RUBBERBAND STERILE (MISCELLANEOUS) IMPLANT
SCREW SELF DRILL HT 1.5/4MM (Screw) ×18 IMPLANT
SPECIMEN JAR SMALL (MISCELLANEOUS) IMPLANT
SPONGE NEURO XRAY DETECT 1X3 (DISPOSABLE) IMPLANT
SPONGE SURGIFOAM ABS GEL 100 (HEMOSTASIS) ×3 IMPLANT
STAPLER VISISTAT 35W (STAPLE) ×3 IMPLANT
SUT ETHILON 3 0 FSL (SUTURE) IMPLANT
SUT NURALON 4 0 TR CR/8 (SUTURE) ×6 IMPLANT
SUT VIC AB 2-0 CP2 18 (SUTURE) ×3 IMPLANT
SYR CONTROL 10ML LL (SYRINGE) ×3 IMPLANT
TAPE CLOTH SURG 4X10 WHT LF (GAUZE/BANDAGES/DRESSINGS) ×3 IMPLANT
TOWEL OR 17X24 6PK STRL BLUE (TOWEL DISPOSABLE) ×3 IMPLANT
TOWEL OR 17X26 10 PK STRL BLUE (TOWEL DISPOSABLE) ×3 IMPLANT
TRAY FOLEY CATH 16FRSI W/METER (SET/KITS/TRAYS/PACK) ×3 IMPLANT
UNDERPAD 30X30 INCONTINENT (UNDERPADS AND DIAPERS) IMPLANT
WATER STERILE IRR 1000ML POUR (IV SOLUTION) ×3 IMPLANT

## 2014-04-21 NOTE — Progress Notes (Signed)
CXR cancelled for today's surgery per Dr Marcie Bal.  Patient just had CT of Chest, Abdomen in [place].  Pulmonary issues are "normal".  DA

## 2014-04-21 NOTE — H&P (Signed)
Reason for admission: Brain tumor   Erik Gross is an 59 y.o. male.   HPI:  59 year old male with a history of bladder cancer who was admitted last week with headaches that led to an MRI that showed multiple brain metastasis. I was consulted and spent a long time talking to the family about his treatment options. He currently decided on preoperative stereotactic radiosurgery followed by surgical resection of the large right posterior temporal mass. He describes headaches but no focal weakness. He has some difficulty with mentation. No change in gait. He uses a cane to ambulate. He was scheduled for removal of his bladder in the next couple of weeks. That has been put on hold and he presents today for right posterior temporal craniotomy for resection of brain met. He underwent stereotactic radiosurgery to 3 lesions yesterday. MRI done yesterday shows 9 total lesions.  Past Medical History  Diagnosis Date  . Bladder tumor   . Nocturia   . Arthritis   . Wears glasses   . Cancer   . Depression   . Anxiety   . Hiccoughs   . Brain tumor 04/14/2014  . Anemia   . History of blood transfusion     Past Surgical History  Procedure Laterality Date  . Lumbar disc surgery  01-17-2013  . Inguinal hernia repair Right 1994  . Transurethral resection of bladder tumor N/A 02/25/2013    Procedure: TRANSURETHRAL RESECTION OF BLADDER TUMOR (TURBT) WITH POST OP MITOMYCIN-C;  Surgeon: Ardis Hughs, MD;  Location: [facility];  Service: Urology;  Laterality: N/A;  . Cystoscopy w/ retrogrades Bilateral 02/25/2013    Procedure: BILATERAL RETROGRADE PYELOGRAM;  Surgeon: Ardis Hughs, MD;  Location: [facility];  Service: Urology;  Laterality: Bilateral;  . Transurethral resection of bladder tumor with gyrus (turbt-gyrus) N/A 12/16/2013    Procedure: TRANSURETHRAL RESECTION OF BLADDER TUMOR AND CLOT EVACUATION;  Surgeon: Ardis Hughs, MD;  Location: [facility];  Service:  Urology;  Laterality: N/A;  . Back surgery      Allergies  Allergen Reactions  . Emend [Fosaprepitant] Shortness Of Breath and Other (See Comments)    Bright red face.    History  Substance Use Topics  . Smoking status: Former Smoker -- 2.00 packs/day for 30 years    Types: Cigarettes    Quit date: 02/24/2004  . Smokeless tobacco: Never Used  . Alcohol Use: No    History reviewed. No pertinent family history.   Review of Systems  Positive ROS: Negative  All other systems have been reviewed and were otherwise negative with the exception of those mentioned in the HPI and as above.  Objective: Vital signs in last 24 hours: Temp:  [97.4 F (36.3 C)-97.5 F (36.4 C)] 97.5 F (36.4 C) (02/03 1135) Pulse Rate:  [73-94] 73 (02/03 1135) Resp:  [16] 16 (02/03 1135) BP: (122-136)/(80-91) 122/80 mmHg (02/03 1135) SpO2:  [98 %-100 %] 100 % (02/03 1135) Weight:  [192 lb (87.091 kg)] 192 lb (87.091 kg) (02/03 1135)  General Appearance: Alert, cooperative, no distress, appears stated age Head: Normocephalic, without obvious abnormality, atraumatic Eyes: PERRL, conjunctiva/corneas clear, EOM's intact     Lungs: Clear to auscultation bilaterally, respirations unlabored Heart: Regular rate and rhythm Abdomen: Soft, non-tender Extremities: Extremities normal, atraumatic, no cyanosis or edema Pulses: 2+ and symmetric all extremities Skin: Skin color, texture, turgor normal, no rashes or lesions  NEUROLOGIC:   Mental status: A&O x4, no aphasia, some slowness of mentation  Motor Exam - grossly normal, normal tone and bulk Sensory Exam - grossly normal Reflexes: symmetric, no pathologic reflexes, No Hoffman's, No clonus Coordination - grossly normal Gait - grossly normal but uses a cane Cranial Nerves: I: smell Not tested  II: visual acuity  OS: na    OD: na  II: visual fields Full to confrontation  II: pupils Equal, round, reactive to light  III,VII: ptosis None  III,IV,VI:  extraocular muscles  Full ROM  V: mastication Normal  V: facial light touch sensation  Normal  V,VII: corneal reflex  Present  VII: facial muscle function - upper  Normal  VII: facial muscle function - lower Normal  VIII: hearing Not tested  IX: soft palate elevation  Normal  IX,X: gag reflex Present  XI: trapezius strength  5/5  XI: sternocleidomastoid strength 5/5  XI: neck flexion strength  5/5  XII: tongue strength  Normal    Data Review Lab Results  Component Value Date   WBC 13.6* 04/21/2014   HGB 13.8 04/21/2014   HCT 41.7 04/21/2014   MCV 94.3 04/21/2014   PLT 216 04/21/2014   Lab Results  Component Value Date   NA 138 04/21/2014   K 4.2 04/21/2014   CL 104 04/21/2014   CO2 23 04/21/2014   BUN 24* 04/21/2014   CREATININE 1.01 04/21/2014   GLUCOSE 99 04/21/2014   Lab Results  Component Value Date   INR 1.02 04/21/2014    Radiology: Mr Jeri Cos Wo Contrast  04/20/2014   CLINICAL DATA:  59 year old male with severe headaches in confusion. Metastatic disease to the brain, current history of bladder cancer. Stereotactic therapy planning protocol. Subsequent encounter.  EXAM: MRI HEAD WITHOUT AND WITH CONTRAST  TECHNIQUE: Multiplanar, multiecho pulse sequences of the brain and surrounding structures were obtained without and with intravenous contrast.  CONTRAST:  51m MULTIHANCE GADOBENATE DIMEGLUMINE 529 MG/ML IV SOLN  COMPARISON:  Brain MRI 04/14/2014.  FINDINGS: Bilateral cerebral edema, moderate severe on the right, has not significantly changed. Associated intracranial mass effect with Stable leftward midline shift (10 mm). Stable partial effacement of the right lateral ventricle. Basilar cisterns remain patent.  Nine enhancing metastases are identified today.  As before, the largest is in the posterior right temporal lobe/anterior occipital lobe with a fungating appearance measuring nearly 5 cm in diameter. There are 6 punctate enhancing metastases today.  There might be  a tenth subcentimeter metastasis in the lateral left occipital lobe seen only on coronal series 8, image 11. Alternatively this could be a normal vascular structure adjacent to the sigmoid sinus. Attention directed on followup.  Heterogeneous bone marrow signal is stable. No destructive osseous lesion identified. Grossly negative visible cervical spinal cord.  IMPRESSION: 1. Nine enhancing metastases identified, and all annotated on series 7. 2. Associated cerebral edema and intracranial mass effect are stable since 04/14/2014. 3. Possible small tenth left occipital lobe metastasis seen only on series 8, image 11. Attention directed on followup.   Electronically Signed   By: LLars PinksM.D.   On: 04/20/2014 13:14     Assessment/Plan: 59year old male admitted for right posterior temporal craniotomy for resection of brain metastasis. We assume this is metastatic from his bladder. I have described the surgery to him in detail. I have tried to answer the family's questions to the best of my ability. They understand the risks of the surgery include but are not limited to bleeding, infection, stroke, loss of vision, numbness, weakness, paralysis, CSF leak, need for further  surgery, lack of relief of symptoms, worsening symptoms, intracerebral hemorrhage, and anesthesia risk including DVT pneumonia MI and death. The family agrees to proceed.   Lilyonna Steidle S 04/21/2014 1:12 PM

## 2014-04-21 NOTE — Anesthesia Preprocedure Evaluation (Signed)
Anesthesia Evaluation  Patient identified by MRN, date of birth, ID band Patient awake    Reviewed: Allergy & Precautions, NPO status , Patient's Chart, lab work & pertinent test results  Airway Mallampati: II   Neck ROM: full    Dental   Pulmonary former smoker,          Cardiovascular negative cardio ROS      Neuro/Psych  Headaches, Anxiety Depression Brain tumor    GI/Hepatic   Endo/Other  obese  Renal/GU      Musculoskeletal  (+) Arthritis -,   Abdominal   Peds  Hematology   Anesthesia Other Findings   Reproductive/Obstetrics                             Anesthesia Physical Anesthesia Plan  ASA: II  Anesthesia Plan: General   Post-op Pain Management:    Induction: Intravenous  Airway Management Planned: Oral ETT  Additional Equipment: Arterial line and CVP  Intra-op Plan:   Post-operative Plan: Extubation in OR  Informed Consent: I have reviewed the patients History and Physical, chart, labs and discussed the procedure including the risks, benefits and alternatives for the proposed anesthesia with the patient or authorized representative who has indicated his/her understanding and acceptance.     Plan Discussed with: CRNA, Anesthesiologist and Surgeon  Anesthesia Plan Comments:         Anesthesia Quick Evaluation

## 2014-04-21 NOTE — Op Note (Signed)
04/21/2014  4:14 PM  PATIENT:  Erik Gross  59 y.o. male  PRE-OPERATIVE DIAGNOSIS:  Right posterior temporal brain mass consistent with brain metastasis  POST-OPERATIVE DIAGNOSIS:  Same  PROCEDURE:  Right posterior temporal craniotomy for resection of right brain mass utilizing BrainLab frameless stereotactic navigation  SURGEON:  Sherley Bounds, MD  ASSISTANTS: Dr. Cyndy Freeze  ANESTHESIA:   General  EBL: 100 ml  Total I/O In: 1950 [I.V.:1950] Out: 400 [Urine:300; Blood:100]  BLOOD ADMINISTERED:none  DRAINS: None   SPECIMEN:  Excision  INDICATION FOR PROCEDURE: This patient has a history of bladder cancer. He presented with headaches. MRI showed multiple metastasis with a large right posterior temporal mass with significant mass effect. He underwent stereotactic radiosurgery to the lesions yesterday. He presents today for planned staged surgical resection of the large right posterior temporal lesion. Patient understood the risks, benefits, and alternatives and potential outcomes and wished to proceed.  PROCEDURE DETAILS: The patient was taken to the operating room and after induction of adequate generalized endotracheal anesthesia, the head was affixed in a 3 point Mayfield head rest, and turned to the left to expose the right frontotemporal parietal region. We registered R BrainLab curve and determined our incision and the location of the mass. The head was shaved and then cleaned and then prepped with DuraPrep and draped in the usual sterile fashion. 10 cc of local anesthetic was injected, and a linear incision was made on the right of the head just behind the ear. Raney clips were placed to establish hemostasis of the scalp, the muscle was reflected with the scalp flap, to expose the right posterior temporal region. A burr hole was placed, and a craniotomy flap was turned utilizing the high-speed, air powered drill. The flap was then placed in bacitracin-containing saline solution, and  the dura was opened to expose the right posterior temporal region. We checked our site with the BrainLab and then A small corticectomy was created with the bipolar forceps, and the underlying tumor was then identified. The tumor was then removed by working in the gliotic plane around the tumor. The overlying gliotic brain was coagulated with bipolar forceps to control any oozing. The tumor was removed en bloc. A small section was sent for frozen pathology was consistent with metastatic bladder cancer. Once the tumor was removed I dried the surgical bed with bipolar cautery and  Surgifoam, inspected the lining of the cavity for any further tumor and then lined the surgical bed with Surgicel. I closed the dura with interrupted 4-0 Nurolon sutures. The dura was lined with Gelfoam, and the craniotomy flap was replaced with doggie-bone plates. The wound was copiously irrigated. The temporalis fascia was closed with 2-0 Vicryl suture and the galea was then closed with interrupted 2-0 Vicryl suture. The skin was then closed with staples a sterile dressing was applied. The patient was then taken out of the 3-point Mayfield headrest and awakened from general anesthesia, and transported to the recovery room in stable condition. At the end of the procedure all sponge, needle, and instrument counts were correct.   PLAN OF CARE: Admit to inpatient   PATIENT DISPOSITION:  ICU - extubated and stable.   Delay start of Pharmacological VTE agent (>24hrs) due to surgical blood loss or risk of bleeding:  yes

## 2014-04-21 NOTE — Anesthesia Procedure Notes (Signed)
Procedure Name: Intubation Date/Time: 04/21/2014 1:54 PM Performed by: Scheryl Darter Pre-anesthesia Checklist: Patient identified, Emergency Drugs available, Suction available, Patient being monitored and Timeout performed Patient Re-evaluated:Patient Re-evaluated prior to inductionOxygen Delivery Method: Circle system utilized Preoxygenation: Pre-oxygenation with 100% oxygen Intubation Type: IV induction Ventilation: Mask ventilation without difficulty Laryngoscope Size: Miller and 3 Grade View: Grade II Tube type: Subglottic suction tube Tube size: 8.0 mm Number of attempts: 1 Airway Equipment and Method: Stylet Placement Confirmation: ETT inserted through vocal cords under direct vision,  positive ETCO2 and breath sounds checked- equal and bilateral Secured at: 23 cm Tube secured with: Tape Dental Injury: Teeth and Oropharynx as per pre-operative assessment

## 2014-04-21 NOTE — Telephone Encounter (Signed)
Lft msg for pt confirming labs/ov r/s ok per MD 6 wks out due to pt is having surgery, mailed out schedule to pt.... KJ

## 2014-04-21 NOTE — Progress Notes (Signed)
[facility] Progress Note Patient Name: Erik Gross DOB: [birth year] MRN: [id number]   Date of Service  04/21/2014  HPI/Events of Note  39 male with PMHx of bladder cancer. He presented with headaches. MRI showed multiple metastasis with a large right posterior temporal mass with significant mass effect. He underwent stereotactic radiosurgery to the lesions yesterday. Today he was planned for staged surgical resection of the large right posterior temporal lesion.   Procedure: Craniotomy tumor excision (right)  eICU Interventions  S\P crainotomy - management per surgery BP control Aspiration precautions.      Intervention Category Evaluation Type: New Patient Evaluation  [name] 04/21/2014, 6:51 PM

## 2014-04-21 NOTE — Anesthesia Postprocedure Evaluation (Signed)
Anesthesia Post Note  Patient: Erik Gross  Procedure(s) Performed: Procedure(s) (LRB): CRANIOTOMY TUMOR EXCISION (Right)  Anesthesia type: General  Patient location: PACU  Post pain: Pain level controlled and Adequate analgesia  Post assessment: Post-op Vital signs reviewed, Patient's Cardiovascular Status Stable, Respiratory Function Stable, Patent Airway and Pain level controlled  Last Vitals:  Filed Vitals:   04/21/14 1730  BP:   Pulse: 67  Temp:   Resp: 13    Post vital signs: Reviewed and stable  Level of consciousness: awake, alert  and oriented  Complications: No apparent anesthesia complications

## 2014-04-21 NOTE — Transfer of Care (Signed)
Immediate Anesthesia Transfer of Care Note  Patient: Advait D Haith  Procedure(s) Performed: Procedure(s): CRANIOTOMY TUMOR EXCISION (Right)  Patient Location: PACU  Anesthesia Type:General  Level of Consciousness: awake, alert  and oriented  Airway & Oxygen Therapy: Patient Spontanous Breathing and Patient connected to nasal cannula oxygen  Post-op Assessment: Report given to RN and Post -op Vital signs reviewed and stable  Post vital signs: Reviewed and stable  Last Vitals:  Filed Vitals:   04/21/14 1135  BP: 122/80  Pulse: 73  Temp: 36.4 C  Resp: 16    Complications: No apparent anesthesia complications

## 2014-04-22 ENCOUNTER — Encounter (HOSPITAL_COMMUNITY): Payer: Self-pay | Admitting: Neurological Surgery

## 2014-04-22 ENCOUNTER — Inpatient Hospital Stay (HOSPITAL_COMMUNITY): Payer: BLUE CROSS/BLUE SHIELD

## 2014-04-22 MED ORDER — GADOBENATE DIMEGLUMINE 529 MG/ML IV SOLN: 20.0000 mL | Freq: Once | INTRAVENOUS | Status: AC | PRN

## 2014-04-22 MED ORDER — DEXAMETHASONE 4 MG PO TABS
4.0000 mg | ORAL_TABLET | Freq: Three times a day (TID) | ORAL | Status: DC
  Administered 2014-04-23 – 2014-04-24 (×2): 4 mg via ORAL
  Filled 2014-04-22 (×4): qty 1

## 2014-04-22 MED ORDER — DEXAMETHASONE 4 MG PO TABS
4.0000 mg | ORAL_TABLET | Freq: Four times a day (QID) | ORAL | Status: AC
  Administered 2014-04-22 – 2014-04-23 (×4): 4 mg via ORAL
  Filled 2014-04-22 (×5): qty 1

## 2014-04-22 NOTE — Progress Notes (Signed)
Chaplain responded to spiritual care consult for advanced directive. Chaplain explained contents and process to pt wife. Pt reports that today will not be a good day to complete because he has a headache. Chaplain asked pt to have nurse page chaplain when pt ready to complete or if he has any additional questions. Page chaplain as needed.    04/22/14 1100  Clinical Encounter Type  Visited With Patient and family together  Visit Type Initial;Spiritual support  Referral From Nurse  Consult/Referral To Chaplain  Recommendations Follow  Spiritual Encounters  Spiritual Needs Emotional  Stress Factors  Patient Stress Factors Exhausted  Family Stress Factors Major life changes  Afnan Emberton, Barbette Hair, Chaplain 04/22/2014 11:21 AM

## 2014-04-22 NOTE — Progress Notes (Signed)
Subjective: Patient reports mild soreness at incision site. Otherwise no headache or visual changes or numbness tingling or weakness.  Objective: Vital signs in last 24 hours: Temp:  [97.4 F (36.3 C)-98.2 F (36.8 C)] 98.2 F (36.8 C) (02/04 0850) Pulse Rate:  [65-109] 78 (02/04 0900) Resp:  [11-33] 18 (02/04 0900) BP: (117-146)/(66-91) 138/78 mmHg (02/04 0900) SpO2:  [95 %-100 %] 96 % (02/04 0900) Arterial Line BP: (144-168)/(71-91) 168/79 mmHg (02/04 0900) Weight:  [192 lb (87.091 kg)-203 lb 0.7 oz (92.1 kg)] 203 lb 0.7 oz (92.1 kg) (02/03 1814)  Intake/Output from previous day: 02/03 0730 - 02/04 0729 In: 2995 [P.O.:120; I.V.:2775; IV Piggyback:100] Out: 2075 [Urine:1975; Blood:100] Intake/Output this shift: Total I/O In: 150 [I.V.:150] Out: 450 [Urine:450]  Neurologic: Grossly normal other than some mild disorientation at times. Moves all extremities well. Dressing dry. Awake alert and conversant.  Lab Results: Lab Results  Component Value Date   WBC 13.6* 04/21/2014   HGB 13.8 04/21/2014   HCT 41.7 04/21/2014   MCV 94.3 04/21/2014   PLT 216 04/21/2014   Lab Results  Component Value Date   INR 1.02 04/21/2014   BMET Lab Results  Component Value Date   NA 138 04/21/2014   K 4.2 04/21/2014   CL 104 04/21/2014   CO2 23 04/21/2014   GLUCOSE 99 04/21/2014   BUN 24* 04/21/2014   CREATININE 1.01 04/21/2014   CALCIUM 9.2 04/21/2014    Studies/Results: Mr Jeri Cos HY Contrast  04/20/2014   CLINICAL DATA:  59 year old male with severe headaches in confusion. Metastatic disease to the brain, current history of bladder cancer. Stereotactic therapy planning protocol. Subsequent encounter.  EXAM: MRI HEAD WITHOUT AND WITH CONTRAST  TECHNIQUE: Multiplanar, multiecho pulse sequences of the brain and surrounding structures were obtained without and with intravenous contrast.  CONTRAST:  63mL MULTIHANCE GADOBENATE DIMEGLUMINE 529 MG/ML IV SOLN  COMPARISON:  Brain MRI  04/14/2014.  FINDINGS: Bilateral cerebral edema, moderate severe on the right, has not significantly changed. Associated intracranial mass effect with Stable leftward midline shift (10 mm). Stable partial effacement of the right lateral ventricle. Basilar cisterns remain patent.  Nine enhancing metastases are identified today.  As before, the largest is in the posterior right temporal lobe/anterior occipital lobe with a fungating appearance measuring nearly 5 cm in diameter. There are 6 punctate enhancing metastases today.  There might be a tenth subcentimeter metastasis in the lateral left occipital lobe seen only on coronal series 8, image 11. Alternatively this could be a normal vascular structure adjacent to the sigmoid sinus. Attention directed on followup.  Heterogeneous bone marrow signal is stable. No destructive osseous lesion identified. Grossly negative visible cervical spinal cord.  IMPRESSION: 1. Nine enhancing metastases identified, and all annotated on series 7. 2. Associated cerebral edema and intracranial mass effect are stable since 04/14/2014. 3. Possible small tenth left occipital lobe metastasis seen only on series 8, image 11. Attention directed on followup.   Electronically Signed   By: Lars Pinks M.D.   On: 04/20/2014 13:14   Dg Chest Port 1 View  04/21/2014   CLINICAL DATA:  59 year old male status post central line placement.  EXAM: PORTABLE CHEST - 1 VIEW  COMPARISON:  CT scan of the chest 03/22/2014  FINDINGS: Right IJ approach a single-lumen power injectable port a catheter with the catheter tip at the distal SVC. There is also now a right IJ approach central venous catheter. The catheter tip projects over the right brachiocephalic vein. No evidence  of pneumothorax or right-sided pleural effusion.  Cardiomegaly. Trace atherosclerotic calcification in the transverse aorta. Nonspecific left basilar opacity which may reflect atelectasis or infiltrate. No overt pulmonary edema. No acute  osseous abnormality.  IMPRESSION: 1. Right IJ central venous catheter with the tip in the mid to lower right brachiocephalic vein. No evidence of pneumothorax or other complication. 2. Stable position of right IJ approach single-lumen power injectable port catheter. 3. Nonspecific left basilar opacity which may reflect atelectasis or infiltrate. 4. Cardiomegaly.   Electronically Signed   By: Jacqulynn Cadet M.D.   On: 04/21/2014 17:44    Assessment/Plan: Doing well postop day 1 after craniotomy for tumor. Mobilized today. Continue Decadron. PTOT. Remove a line and Foley and central line.   LOS: 1 day    Kaydenn Mclear S 04/22/2014, 9:33 AM

## 2014-04-22 NOTE — Progress Notes (Signed)
UR completed.  Taydem Cavagnaro, RN BSN MHA CCM Trauma/Neuro ICU Case Manager [phone]  

## 2014-04-23 MED ORDER — PANTOPRAZOLE SODIUM 40 MG PO TBEC
40.0000 mg | DELAYED_RELEASE_TABLET | Freq: Every day | ORAL | Status: DC
  Administered 2014-04-23: 40 mg via ORAL
  Filled 2014-04-23: qty 1

## 2014-04-23 NOTE — Evaluation (Signed)
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     Clinical Impression   Pt admitted with above.  Both he and spouse report he is functioning better than he was PTA.  Currently, he is able to perform BADLs with supervision.  He does demonstrate impairments with memory and problem solving that significantly impact his ability to perform IADLs (also has a very small area of deficit in Lt superior visual field).   Both pt and spouse were instructed in need for 24 hour supervision/assist and areas of function his deficits may impact him.  Recommend no driving, no operating tools or machinery, no working at this time, and they both agreed.  Recommend OPOT at discharge. Pt for discharge home tomorrow, therefore OT goals not established at this time.     Follow Up Recommendations  Outpatient OT;Supervision/Assistance - 24 hour    Equipment Recommendations  None recommended by OT    Recommendations for Other Services       Precautions / Restrictions Precautions Precautions: None      Mobility Bed Mobility                  Transfers Overall transfer level: Modified independent                    Balance Overall balance assessment: No apparent balance deficits (not formally assessed)             Standing balance comment: Pt simulated shower in standing with no LOB                            ADL Overall ADL's : Needs assistance/impaired Eating/Feeding: Independent   Grooming: Wash/dry hands;Wash/dry face;Oral care;Supervision/safety;Standing   Upper Body Bathing: Supervision/ safety;Sitting   Lower Body Bathing: Supervison/ safety;Sit to/from stand   Upper Body Dressing : Supervision/safety;Sitting   Lower Body Dressing: Supervision/safety;Sit to/from  stand   Toilet Transfer: Supervision/safety;Ambulation;Comfort height toilet   Toileting- Clothing Manipulation and Hygiene: Supervision/safety;Sit to/from stand   Tub/ Shower Transfer: Tub transfer;Supervision/safety;Ambulation   Functional mobility during ADLs: Supervision/safety General ADL Comments: Pt simulated shower in standing with no LOB.  Long discussion with pt and wife re: his deficits and impact on function  Currently, he is functioning better than he was PTA so wife feels very comfortable providing necessary supervision.  They are agreeable to [place] Alignment: Within Functional Limits Alignment/Gaze Preference: Within Defined Limits Ocular Range of Motion: Within Functional Limits Tracking/Visual Pursuits: Able to track stimulus in all quads without difficulty         Additional Comments: Pt appears to have a very small area of deficit in the Lt superior field.  Repeat testing revealed same results.  Pt and wife instructed of the deficit and activities in which it may impact him    Perception Perception Perception Tested?: Yes   Praxis Praxis Praxis tested?: Within functional limits    Pertinent Vitals/Pain Pain Assessment: No/denies pain     Hand Dominance     Extremity/Trunk Assessment Upper Extremity Assessment Upper Extremity Assessment: Overall WFL for tasks assessed   Lower Extremity Assessment Lower Extremity Assessment: Defer to PT evaluation   Cervical / Trunk Assessment Cervical / Trunk Assessment: Normal   Communication Communication  Communication: No difficulties   Cognition Arousal/Alertness: Awake/alert Behavior During Therapy: WFL for tasks assessed/performed Overall Cognitive Status: Impaired/Different from baseline Area of Impairment: Memory;Following commands;Problem solving     Memory: Decreased short-term memory Following Commands: Follows multi-step commands inconsistently     Problem Solving: Difficulty  sequencing;Requires verbal cues;Requires tactile cues General Comments: Pt requires min verbal cues for working memory.  He performed path finding activity on unit - required mod verbal cues.  Pt able to recognize error, and demonstrated ability to correct, but then would immediately make another error.  Pt provided with a multi step directional command and required max cues to complete it.  Pt with difficulty discerning Lt and Rt during directions    General Comments       Exercises       Shoulder Instructions      Home Living Family/patient expects to be discharged to:: Private residence Living Arrangements: Spouse/significant other Available Help at Discharge: Family;Available PRN/intermittently Type of Home: House Home Access: Stairs to enter [employer] of Steps: 2 Entrance Stairs-Rails: None Home Layout: One level     Bathroom Shower/Tub: Tub/shower unit;Walk-in shower Shower/tub characteristics: Architectural technologist: Standard Bathroom Accessibility: Yes How Accessible: Accessible via walker Home Equipment: Cane - single point          Prior Functioning/Environment Level of Independence: Independent with assistive device(s)        Comments: Pt was working full time until ~ 3weeks ago    OT Diagnosis: Generalized weakness;Cognitive deficits;Disturbance of vision   OT Problem List: Impaired vision/perception;Decreased cognition   OT Treatment/Interventions:      OT Goals(Current goals can be found in the care plan section) Acute Rehab OT Goals Patient Stated Goal: Home ASAP OT Goal Formulation: All assessment and education complete, DC therapy  OT Frequency:     Barriers to D/C:            Co-evaluation              End of Session Nurse Communication: Mobility status  Activity Tolerance: Patient tolerated treatment well Patient left: in bed;with call bell/phone within reach;with family/visitor present   Time: 9678-9381 OT Time  Calculation (min): 45 min Charges:  OT General Charges $OT Visit: 1 Procedure OT Evaluation $Initial OT Evaluation Tier I: 1 Procedure OT Treatments $Self Care/Home Management : 8-22 mins $Therapeutic Activity: 8-22 mins G-Codes:    Stephane Junkins M May 02, 2014, 5:45 PM

## 2014-04-23 NOTE — Progress Notes (Signed)
Subjective: Patient reports he's doing very well. No headache. No NW  Objective: Vital signs in last 24 hours: Temp:  [97.8 F (36.6 C)-98.9 F (37.2 C)] 98.4 F (36.9 C) (02/05 0400) Pulse Rate:  [69-100] 74 (02/05 0400) Resp:  [17-24] 21 (02/05 0400) BP: (119-151)/(68-93) 123/68 mmHg (02/05 0400) SpO2:  [94 %-97 %] 96 % (02/05 0400) Arterial Line BP: (157-168)/(75-79) 168/79 mmHg (02/04 0900)  Intake/Output from previous day: 02/04 0730 - 02/05 0729 In: 805 [P.O.:420; I.V.:385] Out: 2225 [Urine:2225] Intake/Output this shift: Total I/O In: 805 [P.O.:420; I.V.:385] Out: 2225 [Urine:2225]  Neurologic: Grossly normal  Lab Results: Lab Results  Component Value Date   WBC 13.6* 04/21/2014   HGB 13.8 04/21/2014   HCT 41.7 04/21/2014   MCV 94.3 04/21/2014   PLT 216 04/21/2014   Lab Results  Component Value Date   INR 1.02 04/21/2014   BMET Lab Results  Component Value Date   NA 138 04/21/2014   K 4.2 04/21/2014   CL 104 04/21/2014   CO2 23 04/21/2014   GLUCOSE 99 04/21/2014   BUN 24* 04/21/2014   CREATININE 1.01 04/21/2014   CALCIUM 9.2 04/21/2014    Studies/Results: Mr Jeri Cos RX Contrast  04/22/2014   CLINICAL DATA:  59 year old male status post stereotactic resection of dominant brain tumor. Suspected metastatic disease. Restaging. Subsequent encounter.  EXAM: MRI HEAD WITHOUT AND WITH CONTRAST  TECHNIQUE: Multiplanar, multiecho pulse sequences of the brain and surrounding structures were obtained without and with intravenous contrast.  CONTRAST:  20 mL MultiHance.  COMPARISON:  Preoperative targeting brain MRI 04/20/2014 and earlier.  FINDINGS: The target of resection was the nearly 5 cm right hemisphere mass. Overlying craniotomy. Cystic resection cavity in that region with intrinsic T1 blood products. Thin rim of some restricted diffusion at the cavity site (series 3, image 19, also with superimposed residual nodular postcontrast enhancement just deep to the  resection cavity.  The most confluent areas of this is suspicious residual enhancement are along the superior in medial aspect of the resection cavity, best demonstrated on series 13, image 9 and image 10.  Continued widespread surrounding T2 and FLAIR hyperintensity in keeping with cerebral edema. However, mass effect has regressed. Midline shift has decreased from 10-7 mm. Mildly improved patency of the right lateral ventricle.  Other previously identified bilateral brain metastases are stable. However, 2 new punctate metastases are detected today with 3 Tesla imaging. All lesions are annotated on series 12.  Elsewhere No restricted diffusion or evidence of acute infarction. Major intracranial vascular flow voids are stable. Negative pituitary gland. Stable and negative visualized cervical spine. Small volume postoperative scalp hematoma and extra-axial collection under the craniotomy site. Stable paranasal sinuses, mastoids, and orbits soft tissues. Stable bone marrow signal.  IMPRESSION: 1. Resection of the dominant right hemisphere brain metastasis. Small areas of residual nodular enhancement along the superior and medial aspects of the resection cavity. Attention directed on followup. 2. Ten other metastases identified on this 3 Tesla study (note that 2 additional punctate metastases are detected today). All lesions annotated on series 12. 3. Mildly decreased right cerebral hemisphere edema and mass effect.   Electronically Signed   By: Lars Pinks M.D.   On: 04/22/2014 13:54   Dg Chest Port 1 View  04/21/2014   CLINICAL DATA:  59 year old male status post central line placement.  EXAM: PORTABLE CHEST - 1 VIEW  COMPARISON:  CT scan of the chest 03/22/2014  FINDINGS: Right IJ approach a single-lumen power injectable  port a catheter with the catheter tip at the distal SVC. There is also now a right IJ approach central venous catheter. The catheter tip projects over the right brachiocephalic vein. No evidence of  pneumothorax or right-sided pleural effusion.  Cardiomegaly. Trace atherosclerotic calcification in the transverse aorta. Nonspecific left basilar opacity which may reflect atelectasis or infiltrate. No overt pulmonary edema. No acute osseous abnormality.  IMPRESSION: 1. Right IJ central venous catheter with the tip in the mid to lower right brachiocephalic vein. No evidence of pneumothorax or other complication. 2. Stable position of right IJ approach single-lumen power injectable port catheter. 3. Nonspecific left basilar opacity which may reflect atelectasis or infiltrate. 4. Cardiomegaly.   Electronically Signed   By: Jacqulynn Cadet M.D.   On: 04/21/2014 17:44    Assessment/Plan: Doing great. To floor today. Home tomorrow   LOS: 2 days    Unique Searfoss S 04/23/2014, 7:24 AM

## 2014-04-23 NOTE — Progress Notes (Signed)
Patient transferred from [place]. Patient alert and oriented x 4. Patient oriented to room. Denies any pain at this time. Will continue to monitor.

## 2014-04-23 NOTE — Evaluation (Signed)
Physical Therapy Evaluation Patient Details Name: Erik Gross MRN: [id number] DOB: 07/22/1955 Today's Date: 04/23/2014   History of Present Illness  pt presents post Crani for R Temporal Mass Excision with Brain Mets and Bladder Cancer.    Clinical Impression  Pt moving well with only minor deficits.  Feel pt will make great progress to D/C to home.  Will continue to follow while on acute.      Follow Up Recommendations No PT follow up;Supervision - Intermittent    Equipment Recommendations  None recommended by PT    Recommendations for Other Services       Precautions / Restrictions Precautions Precautions: None Restrictions Weight Bearing Restrictions: No      Mobility  Bed Mobility               General bed mobility comments: pt sitting in chair on arrival.    Transfers Overall transfer level: Modified independent Equipment used: None             General transfer comment: Demos good technique, but definite use of UEs.    Ambulation/Gait Ambulation/Gait assistance: Min guard Ambulation Distance (Feet): 150 Feet Assistive device:  (IV pole) Gait Pattern/deviations: Step-through pattern;Decreased stride length     General Gait Details: pt moves slowly and utilizes IV pole instead of cane for support.  pt with one small LOB during ambulation, but pt able to self-correct.    Stairs            Wheelchair Mobility    Modified Rankin (Stroke Patients Only)       Balance Overall balance assessment: Needs assistance Sitting-balance support: No upper extremity supported;Feet supported Sitting balance-Leahy Scale: Good     Standing balance support: No upper extremity supported Standing balance-Leahy Scale: Poor Standing balance comment: Needs UE support for dynamic balance.                               Pertinent Vitals/Pain Pain Assessment: No/denies pain    Home Living Family/patient expects to be discharged to:: Private  residence Living Arrangements: Spouse/significant other Available Help at Discharge: Family;Available PRN/intermittently Type of Home: House Home Access: Stairs to enter Entrance Stairs-Rails: None Entrance Stairs-Number of Steps: 2 Home Layout: One level Home Equipment: Cane - single point      Prior Function Level of Independence: Independent with assistive device(s)               Hand Dominance        Extremity/Trunk Assessment   Upper Extremity Assessment: Defer to OT evaluation           Lower Extremity Assessment: Overall WFL for tasks assessed      Cervical / Trunk Assessment: Normal  Communication   Communication: No difficulties  Cognition Arousal/Alertness: Awake/alert Behavior During Therapy: WFL for tasks assessed/performed Overall Cognitive Status: Within Functional Limits for tasks assessed                      General Comments      Exercises        Assessment/Plan    PT Assessment Patient needs continued PT services  PT Diagnosis Difficulty walking   PT Problem List Decreased activity tolerance;Decreased balance;Decreased mobility;Decreased knowledge of use of DME  PT Treatment Interventions DME instruction;Gait training;Stair training;Functional mobility training;Therapeutic activities;Therapeutic exercise;Balance training;Neuromuscular re-education;Patient/family education   PT Goals (Current goals can be found in the Care Plan section)  Acute Rehab PT Goals Patient Stated Goal: Home soon. PT Goal Formulation: With patient Time For Goal Achievement: 04/30/14 Potential to Achieve Goals: Good    Frequency Min 3X/week   Barriers to discharge        Co-evaluation               End of Session Equipment Utilized During Treatment: Gait belt Activity Tolerance: Patient tolerated treatment well Patient left: in chair;with call bell/phone within reach Nurse Communication: Mobility status         Time:  0817-0829 PT Time Calculation (min) (ACUTE ONLY): 12 min   Charges:   PT Evaluation $Initial PT Evaluation Tier I: 1 Procedure     PT G CodesCatarina Gross, [place] 04/23/2014, 10:46 AM

## 2014-04-24 MED ORDER — OXYCODONE HCL 5 MG PO TABS: 5.0000 mg | ORAL_TABLET | ORAL | Status: DC | PRN

## 2014-04-24 MED ORDER — DEXAMETHASONE 4 MG PO TABS: 4.0000 mg | ORAL_TABLET | Freq: Two times a day (BID) | ORAL | Status: DC

## 2014-04-24 NOTE — Progress Notes (Signed)
Subjective: Patient reports doing well.  Wants to go home.  Objective: Vital signs in last 24 hours: Temp:  [97.8 F (36.6 C)-98.5 F (36.9 C)] 98.4 F (36.9 C) (02/06 0957) Pulse Rate:  [73-94] 82 (02/06 0957) Resp:  [18-20] 20 (02/06 0957) BP: (118-140)/(60-91) 119/83 mmHg (02/06 0957) SpO2:  [97 %-100 %] 100 % (02/06 0957)  Intake/Output from previous day: 02/05 0701 - 02/06 0700 In: 380 [P.O.:360; I.V.:20] Out: 100 [Urine:100] Intake/Output this shift:    Physical Exam: Benign exam.  Dressing CDI.  No headache.  Balance good.  Lab Results:  Recent Labs  04/21/14 1116  WBC 13.6*  HGB 13.8  HCT 41.7  PLT 216   BMET  Recent Labs  04/21/14 1116  NA 138  K 4.2  CL 104  CO2 23  GLUCOSE 99  BUN 24*  CREATININE 1.01  CALCIUM 9.2    Studies/Results: Mr [name] Contrast  04/22/2014   CLINICAL DATA:  59 year old male status post stereotactic resection of dominant brain tumor. Suspected metastatic disease. Restaging. Subsequent encounter.  EXAM: MRI HEAD WITHOUT AND WITH CONTRAST  TECHNIQUE: Multiplanar, multiecho pulse sequences of the brain and surrounding structures were obtained without and with intravenous contrast.  CONTRAST:  20 mL MultiHance.  COMPARISON:  Preoperative targeting brain MRI 04/20/2014 and earlier.  FINDINGS: The target of resection was the nearly 5 cm right hemisphere mass. Overlying craniotomy. Cystic resection cavity in that region with intrinsic T1 blood products. Thin rim of some restricted diffusion at the cavity site (series 3, image 19, also with superimposed residual nodular postcontrast enhancement just deep to the resection cavity.  The most confluent areas of this is suspicious residual enhancement are along the superior in medial aspect of the resection cavity, best demonstrated on series 13, image 9 and image 10.  Continued widespread surrounding T2 and FLAIR hyperintensity in keeping with cerebral edema. However, mass effect has  regressed. Midline shift has decreased from 10-7 mm. Mildly improved patency of the right lateral ventricle.  Other previously identified bilateral brain metastases are stable. However, 2 new punctate metastases are detected today with 3 Tesla imaging. All lesions are annotated on series 12.  Elsewhere No restricted diffusion or evidence of acute infarction. Major intracranial vascular flow voids are stable. Negative pituitary gland. Stable and negative visualized cervical spine. Small volume postoperative scalp hematoma and extra-axial collection under the craniotomy site. Stable paranasal sinuses, mastoids, and orbits soft tissues. Stable bone marrow signal.  IMPRESSION: 1. Resection of the dominant right hemisphere brain metastasis. Small areas of residual nodular enhancement along the superior and medial aspects of the resection cavity. Attention directed on followup. 2. Ten other metastases identified on this 3 Tesla study (note that 2 additional punctate metastases are detected today). All lesions annotated on series 12. 3. Mildly decreased right cerebral hemisphere edema and mass effect.   Electronically Signed   By: Lars Pinks M.D.   On: 04/22/2014 13:54    Assessment/Plan: Doing well.  Discharge home.  Will continue low dose decadron and give Rx for hydrocodone for pain.      LOS: 3 days    Peggyann Shoals, MD 04/24/2014, 10:11 AM

## 2014-04-24 NOTE — Progress Notes (Signed)
Physical Therapy Treatment Patient Details Name: Erik Gross MRN: [id number] DOB: 1955/12/19 Today's Date: 05-14-14    History of Present Illness pt presents post Crani for R Temporal Mass Excision with Brain Mets and Bladder Cancer.      PT Comments    Patient has met all PT goals.  Able to negotiate stairs with supervision.  Patient ready for d/c from PT perspective.  Follow Up Recommendations  No PT follow up;Supervision - Intermittent     Equipment Recommendations  None recommended by PT    Recommendations for Other Services       Precautions / Restrictions Precautions Precautions: None Restrictions Weight Bearing Restrictions: No    Mobility  Bed Mobility Overal bed mobility: Independent                Transfers Overall transfer level: Modified independent Equipment used: None             General transfer comment: Good technique and balance.  Ambulation/Gait Ambulation/Gait assistance: Supervision Ambulation Distance (Feet): 200 Feet Assistive device: None Gait Pattern/deviations: WFL(Within Functional Limits)     General Gait Details: Able to ambulate without assistive device with good balance.     Stairs Stairs: Yes Stairs assistance: Supervision Stair Management: One rail Right;Alternating pattern;Forwards Number of Stairs: 5 General stair comments: Patient able to complete stairs with supervision only.  Encouraged patient to hold rail for safety.  Wheelchair Mobility    Modified Rankin (Stroke Patients Only)       Balance Overall balance assessment: No apparent balance deficits (not formally assessed)                           High level balance activites: Turns;Sudden stops;Head turns High Level Balance Comments: No loss of balance with high level balance activities    Cognition Arousal/Alertness: Awake/alert Behavior During Therapy: WFL for tasks assessed/performed Overall Cognitive Status: Within Functional  Limits for tasks assessed                      Exercises      General Comments        Pertinent Vitals/Pain Pain Assessment: No/denies pain    Home Living                      Prior Function            PT Goals (current goals can now be found in the care plan section) Progress towards PT goals: Goals met/education completed, patient discharged from PT    Frequency  Min 3X/week    PT Plan Current plan remains appropriate    Co-evaluation             End of Session   Activity Tolerance: Patient tolerated treatment well Patient left: in chair;with call bell/phone within reach;with family/visitor present     Time: 4098-1191 PT Time Calculation (min) (ACUTE ONLY): 11 min  Charges:  $Gait Training: 8-22 mins                    G Codes:      Despina Pole 05/14/14, 1:05 PM Carita Pian. Sanjuana Kava, [place] Pager [phone]

## 2014-04-24 NOTE — Progress Notes (Signed)
Patient is being d/c, d/c instruction given and patient verbalized understanding.

## 2014-04-24 NOTE — Discharge Summary (Signed)
Physician Discharge Summary  Patient ID: Erik Gross MRN: [id number] DOB/AGE: 04-15-1955 59 y.o.  Admit date: 04/21/2014 Discharge date: 04/24/2014  Admission Diagnoses: Bladder cancer with brain metastases  Discharge Diagnoses: Bladder cancer with brain metastases Active Problems:   S/P craniotomy   Discharged Condition: good  Hospital Course: Patient underwent stereotactic craniotomy and tumor resection for right posterior temporal brain metastasis after preoperative stereotactic radiosurgery.  He tolerated procedure well.  Consults: None  Significant Diagnostic Studies: radiology: MRI: Good tumor resection, small amount of residual, 10 new deposits seen  Treatments: surgery: stereotactic craniotomy and tumor resection for right posterior temporal brain metastasis  Discharge Exam: Blood pressure 119/83, pulse 82, temperature 98.4 F (36.9 C), temperature source Oral, resp. rate 20, height 5\' 6"  (1.676 m), weight 92.1 kg (203 lb 0.7 oz), SpO2 100 %. Neurologic: Alert and oriented X 3, normal strength and tone. Normal symmetric reflexes. Normal coordination and gait Wound:CDI  Disposition: Home     Medication List    TAKE these medications        acetaminophen 500 MG tablet  Commonly known as:  TYLENOL  Take 500 mg by mouth every 4 (four) hours as needed for mild pain or headache.     BENEFIBER DRINK MIX PO  Take 1 packet by mouth daily.     calcium carbonate 500 MG chewable tablet  Commonly known as:  TUMS - dosed in mg elemental calcium  Chew 1 tablet by mouth daily.     chlorproMAZINE 25 MG tablet  Commonly known as:  THORAZINE  Take 1 tablet (25 mg total) by mouth 3 (three) times daily as needed for hiccoughs.     dexamethasone 4 MG tablet  Commonly known as:  DECADRON  Take 1 tablet (4 mg total) by mouth every 6 (six) hours.     dexamethasone 4 MG tablet  Commonly known as:  DECADRON  Take 1 tablet (4 mg total) by mouth 2 (two) times daily.     docusate  sodium 100 MG capsule  Commonly known as:  COLACE  Take 1 capsule (100 mg total) by mouth 2 (two) times daily as needed (take to keep stool soft.).     etodolac 400 MG tablet  Commonly known as:  LODINE  Take 400 mg by mouth 2 (two) times daily.     lidocaine-prilocaine cream  Commonly known as:  EMLA  Apply 1 application topically as needed. Apply to port on the day of chemotherapy.     LORazepam 1 MG tablet  Commonly known as:  ATIVAN  Take 1 tablet (1 mg total) by mouth every 6 (six) hours as needed for anxiety.     multivitamin tablet  Take 1 tablet by mouth daily.     ondansetron 8 MG tablet  Commonly known as:  ZOFRAN  Take 1 tablet (8 mg total) by mouth every 8 (eight) hours as needed for nausea or vomiting.     oxyCODONE 5 MG immediate release tablet  Commonly known as:  ROXICODONE  Take 1 tablet (5 mg total) by mouth every 4 (four) hours as needed for moderate pain or severe pain.     polyethylene glycol packet  Commonly known as:  MIRALAX / GLYCOLAX  Take 17 g by mouth daily.     prochlorperazine 10 MG tablet  Commonly known as:  COMPAZINE  Take 1 tablet (10 mg total) by mouth every 6 (six) hours as needed for nausea or vomiting.     ranitidine 75  MG tablet  Commonly known as:  ZANTAC  Take 75 mg by mouth 2 (two) times daily.     STIMULANT LAXATIVE 5 MG EC tablet  Generic drug:  bisacodyl  Take 10 mg by mouth daily as needed for mild constipation.     TOVIAZ 4 MG Tb24 tablet  Generic drug:  fesoterodine  Take 4 mg by mouth daily.     TURMERIC PO  Take 1 tablet by mouth daily.     zolpidem 5 MG tablet  Commonly known as:  AMBIEN  Take 1 tablet (5 mg total) by mouth at bedtime as needed for sleep.         Signed: Peggyann Shoals, MD 04/24/2014, 11:00 AM

## 2014-04-26 NOTE — Progress Notes (Signed)
°  Radiation Oncology         [phone]) [phone] ________________________________  Name: Erik Gross MRN: [id number]  Date: 04/20/2014  DOB: 01/25/1956  End of Treatment Note  Diagnosis:  59 yo man with 5 brain metastases from metastatic bladder cancer    Indication for treatment:  Palliation       Radiation treatment dates:   04/20/2014  Site/dose/beams/energy:   Edmar D Bias received stereotactic radiosurgery to the following targets:  Right temporal 51 mm target was treated using 4 Rapid Arc VMAT Beams to a prescription dose of 14 Gy. ExacTrac registration was performed for each couch angle. The 100% isodose line was prescribed. 6 MV X-rays were delivered in the flattening filter free beam mode.  Left Parietal 18 mm target was treated using 4 Rapid Arc VMAT Beams to a prescription dose of 20 Gy. ExacTrac registration was performed for each couch angle. The 100% isodose line was prescribed. 6 MV X-rays were delivered in the flattening filter free beam mode.  Left Cerebellar 14 mm target was treated using 4 Rapid Arc VMAT Beams to a prescription dose of 20 Gy. ExacTrac registration was performed for each couch angle. The 100% isodose line was prescribed. 6 MV X-rays were delivered in the flattening filter free beam mode.  The 4 VMAT arcs were delivered to a single isocenter.  Narrative: The patient tolerated radiation treatment relatively well.     Plan: The patient has completed radiation treatment. The patient will proceed to resection of his dominant brain metastasis and he will return to radiation oncology clinic for routine followup in one month. I advised them to call or return sooner if they have any questions or concerns related to their recovery or treatment. ________________________________  Sheral Apley. Tammi Klippel, M.D.

## 2014-04-27 ENCOUNTER — Ambulatory Visit: Payer: BC Managed Care – PPO | Admitting: Oncology

## 2014-04-27 ENCOUNTER — Other Ambulatory Visit: Payer: BLUE CROSS/BLUE SHIELD

## 2014-04-27 ENCOUNTER — Other Ambulatory Visit: Payer: BC Managed Care – PPO

## 2014-04-30 ENCOUNTER — Other Ambulatory Visit: Payer: Self-pay | Admitting: Radiation Oncology

## 2014-04-30 ENCOUNTER — Telehealth: Payer: Self-pay | Admitting: Radiation Oncology

## 2014-04-30 DIAGNOSIS — D500 Iron deficiency anemia secondary to blood loss (chronic): Secondary | ICD-10-CM

## 2014-04-30 DIAGNOSIS — Z7952 Long term (current) use of systemic steroids: Secondary | ICD-10-CM

## 2014-04-30 DIAGNOSIS — B370 Candidal stomatitis: Secondary | ICD-10-CM

## 2014-04-30 DIAGNOSIS — C679 Malignant neoplasm of bladder, unspecified: Secondary | ICD-10-CM

## 2014-04-30 MED ORDER — FLUCONAZOLE 100 MG PO TABS: 100.0000 mg | ORAL_TABLET | Freq: Every day | ORAL | Status: DC

## 2014-04-30 NOTE — Telephone Encounter (Signed)
Called patient's wife, [name], informed her Diflucan has been called to her husband's pharmacy for pick up.

## 2014-05-06 ENCOUNTER — Telehealth: Payer: Self-pay | Admitting: Radiation Oncology

## 2014-05-06 NOTE — Telephone Encounter (Signed)
Received a call from the patient's wife, [name]. She questions when her husband's "brain will be treated again." She goes on to explain he is passing sediment in his urine and is concerned his bladder cancer is progressing. She states, "they won't do anything with his bladder until ya'll are done with his brain." Route the message to [employer].

## 2014-05-06 NOTE — Telephone Encounter (Signed)
Follow-up one month after SRS to discuss.

## 2014-05-11 NOTE — Progress Notes (Signed)
Radiation Oncology         [phone]   Name: Erik Gross   Date: 05/12/2014   MRN: [id number]  DOB: 02-07-1956    Multidisciplinary Brain and Spine Oncology Clinic Follow-Up Visit Note  CC: Erik Gross., MD  Erik Aus, MD    ICD-9-CM ICD-10-CM   1. Brain metastases 198.3 C79.31     Diagnosis:   59 yo man with 5 brain metastases from metastatic bladder cancer s/p SRS on 04/20/2014 with pre-op 14  Gy to a Right temporal 51 mm target and definitive 20 Gy to a Left Parietal 18 mm target and a Left Cerebellar 14 mm target  Interval Since Last Radiation:  3  weeks  Narrative:  The patient returns today for routine follow-up.  The recent films were presented in our multidisciplinary conference with neuroradiology just prior to the clinic.  Patient is recovering well from surgery and has no significant complaints. He does note some increasing passage of particles in his urine stream which usually in the past has been a predecessor to hematuria.                              ALLERGIES:  is allergic to [facility].  Meds: Current Outpatient Prescriptions  Medication Sig Dispense Refill  . Multiple Vitamin (MULTIVITAMIN) tablet Take 1 tablet by mouth daily.    . vitamin B-12 (CYANOCOBALAMIN) 1000 MCG tablet Take 1,000 mcg by mouth daily.    Marland Kitchen acetaminophen (TYLENOL) 500 MG tablet Take 500 mg by mouth every 4 (four) hours as needed for mild pain or headache.     . bisacodyl (STIMULANT LAXATIVE) 5 MG EC tablet Take 10 mg by mouth daily as needed for mild constipation.     . calcium carbonate (TUMS - DOSED IN MG ELEMENTAL CALCIUM) 500 MG chewable tablet Chew 1 tablet by mouth daily.    . calcium carbonate (TUMS EX) 750 MG chewable tablet Chew by mouth.    . chlorproMAZINE (THORAZINE) 25 MG tablet Take 1 tablet (25 mg total) by mouth 3 (three) times daily as needed for hiccoughs. (Patient not taking: Reported on 05/12/2014) 45 tablet 2  . dexamethasone (DECADRON) 4 MG tablet Take 1 tablet (4  mg total) by mouth every 6 (six) hours. (Patient not taking: Reported on 05/12/2014) 60 tablet 0  . dexamethasone (DECADRON) 4 MG tablet Take 1 tablet (4 mg total) by mouth 2 (two) times daily. (Patient not taking: Reported on 05/12/2014) 40 tablet 0  . dexamethasone (DECADRON) 4 MG tablet Take by mouth.    . docusate sodium (COLACE) 100 MG capsule Take 1 capsule (100 mg total) by mouth 2 (two) times daily as needed (take to keep stool soft.). (Patient not taking: Reported on 05/12/2014) 60 capsule 0  . etodolac (LODINE) 400 MG tablet Take 400 mg by mouth 2 (two) times daily.    . fluconazole (DIFLUCAN) 100 MG tablet Take 1 tablet (100 mg total) by mouth daily. Take 2 on first day (Patient not taking: Reported on 05/12/2014) 11 tablet 0  . lidocaine-prilocaine (EMLA) cream Apply 1 application topically as needed. Apply to port on the day of chemotherapy. (Patient not taking: Reported on 04/15/2014) 30 g 0  . LORazepam (ATIVAN) 1 MG tablet Take 1 tablet (1 mg total) by mouth every 6 (six) hours as needed for anxiety. (Patient not taking: Reported on 05/12/2014) 60 tablet 0  . ondansetron (ZOFRAN) 8 MG tablet  Take 1 tablet (8 mg total) by mouth every 8 (eight) hours as needed for nausea or vomiting. (Patient not taking: Reported on 04/15/2014) 30 tablet 1  . oxyCODONE (ROXICODONE) 5 MG immediate release tablet Take 1 tablet (5 mg total) by mouth every 4 (four) hours as needed for moderate pain or severe pain. (Patient not taking: Reported on 05/12/2014) 30 tablet 0  . polyethylene glycol (MIRALAX / GLYCOLAX) packet Take 17 g by mouth daily.    . polyethylene glycol powder (GLYCOLAX/MIRALAX) powder Take by mouth.    . prochlorperazine (COMPAZINE) 10 MG tablet Take 1 tablet (10 mg total) by mouth every 6 (six) hours as needed for nausea or vomiting. (Patient not taking: Reported on 04/15/2014) 30 tablet 1  . ranitidine (ZANTAC) 75 MG tablet Take 75 mg by mouth 2 (two) times daily.    . TOVIAZ 4 MG TB24 tablet Take  4 mg by mouth daily.  3  . TURMERIC PO Take 1 tablet by mouth daily.    . Wheat Dextrin (BENEFIBER DRINK MIX PO) Take 1 packet by mouth daily.     Marland Kitchen zolpidem (AMBIEN) 5 MG tablet Take 1 tablet (5 mg total) by mouth at bedtime as needed for sleep. (Patient not taking: Reported on 04/15/2014) 30 tablet 0   No current facility-administered medications for this encounter.    Physical Findings: The patient is in no acute distress. Patient is alert and oriented.  weight is 194 lb 9.6 oz (88.27 kg). His oral temperature is 98 F (36.7 C). His blood pressure is 128/81 and his pulse is 96. His respiration is 16 and oxygen saturation is 100%. . Craniotomy incision well attended with no evidence of drainage or infection.  No significant changes.  Lab Findings: Lab Results  Component Value Date   WBC 13.6* 04/21/2014   HGB 13.8 04/21/2014   HCT 41.7 04/21/2014   MCV 94.3 04/21/2014   PLT 216 04/21/2014    @LASTCHEM @  Radiographic Findings: Mr [name] Contrast  04/26/2014   ADDENDUM REPORT: 04/26/2014 08:48  ADDENDUM: There remains the indeterminate small focus of enhancement in the left occipital lobe seen only on coronal post contrast images; series 13 image 7 today.  If confirmed pathologic this would make 12 total metastases including that recently resected.  Attention directed on followup.  This was discussed at Multidisciplinary Brain Conference on the morning of 04/26/2014.   Electronically Signed   By: Lars Pinks M.D.   On: 04/26/2014 08:48   04/26/2014   CLINICAL DATA:  59 year old male status post stereotactic resection of dominant brain tumor. Suspected metastatic disease. Restaging. Subsequent encounter.  EXAM: MRI HEAD WITHOUT AND WITH CONTRAST  TECHNIQUE: Multiplanar, multiecho pulse sequences of the brain and surrounding structures were obtained without and with intravenous contrast.  CONTRAST:  20 mL MultiHance.  COMPARISON:  Preoperative targeting brain MRI 04/20/2014 and earlier.   FINDINGS: The target of resection was the nearly 5 cm right hemisphere mass. Overlying craniotomy. Cystic resection cavity in that region with intrinsic T1 blood products. Thin rim of some restricted diffusion at the cavity site (series 3, image 19, also with superimposed residual nodular postcontrast enhancement just deep to the resection cavity.  The most confluent areas of this is suspicious residual enhancement are along the superior in medial aspect of the resection cavity, best demonstrated on series 13, image 9 and image 10.  Continued widespread surrounding T2 and FLAIR hyperintensity in keeping with cerebral edema. However, mass effect has regressed. Midline  shift has decreased from 10-7 mm. Mildly improved patency of the right lateral ventricle.  Other previously identified bilateral brain metastases are stable. However, 2 new punctate metastases are detected today with 3 Tesla imaging. All lesions are annotated on series 12.  Elsewhere No restricted diffusion or evidence of acute infarction. Major intracranial vascular flow voids are stable. Negative pituitary gland. Stable and negative visualized cervical spine. Small volume postoperative scalp hematoma and extra-axial collection under the craniotomy site. Stable paranasal sinuses, mastoids, and orbits soft tissues. Stable bone marrow signal.  IMPRESSION: 1. Resection of the dominant right hemisphere brain metastasis. Small areas of residual nodular enhancement along the superior and medial aspects of the resection cavity. Attention directed on followup. 2. Ten other metastases identified on this 3 Tesla study (note that 2 additional punctate metastases are detected today). All lesions annotated on series 12. 3. Mildly decreased right cerebral hemisphere edema and mass effect.  Electronically Signed: By: Lars Pinks M.D. On: 04/22/2014 13:54   Mr Jeri Cos GH Contrast  04/20/2014   CLINICAL DATA:  59 year old male with severe headaches in confusion.  Metastatic disease to the brain, current history of bladder cancer. Stereotactic therapy planning protocol. Subsequent encounter.  EXAM: MRI HEAD WITHOUT AND WITH CONTRAST  TECHNIQUE: Multiplanar, multiecho pulse sequences of the brain and surrounding structures were obtained without and with intravenous contrast.  CONTRAST:  74mL MULTIHANCE GADOBENATE DIMEGLUMINE 529 MG/ML IV SOLN  COMPARISON:  Brain MRI 04/14/2014.  FINDINGS: Bilateral cerebral edema, moderate severe on the right, has not significantly changed. Associated intracranial mass effect with Stable leftward midline shift (10 mm). Stable partial effacement of the right lateral ventricle. Basilar cisterns remain patent.  Nine enhancing metastases are identified today.  As before, the largest is in the posterior right temporal lobe/anterior occipital lobe with a fungating appearance measuring nearly 5 cm in diameter. There are 6 punctate enhancing metastases today.  There might be a tenth subcentimeter metastasis in the lateral left occipital lobe seen only on coronal series 8, image 11. Alternatively this could be a normal vascular structure adjacent to the sigmoid sinus. Attention directed on followup.  Heterogeneous bone marrow signal is stable. No destructive osseous lesion identified. Grossly negative visible cervical spinal cord.  IMPRESSION: 1. Nine enhancing metastases identified, and all annotated on series 7. 2. Associated cerebral edema and intracranial mass effect are stable since 04/14/2014. 3. Possible small tenth left occipital lobe metastasis seen only on series 8, image 11. Attention directed on followup.   Electronically Signed   By: Lars Pinks M.D.   On: 04/20/2014 13:14   Dg Chest Port 1 View  04/21/2014   CLINICAL DATA:  59 year old male status post central line placement.  EXAM: PORTABLE CHEST - 1 VIEW  COMPARISON:  CT scan of the chest 03/22/2014  FINDINGS: Right IJ approach a single-lumen power injectable port a catheter with the  catheter tip at the distal SVC. There is also now a right IJ approach central venous catheter. The catheter tip projects over the right brachiocephalic vein. No evidence of pneumothorax or right-sided pleural effusion.  Cardiomegaly. Trace atherosclerotic calcification in the transverse aorta. Nonspecific left basilar opacity which may reflect atelectasis or infiltrate. No overt pulmonary edema. No acute osseous abnormality.  IMPRESSION: 1. Right IJ central venous catheter with the tip in the mid to lower right brachiocephalic vein. No evidence of pneumothorax or other complication. 2. Stable position of right IJ approach single-lumen power injectable port catheter. 3. Nonspecific left basilar opacity which  may reflect atelectasis or infiltrate. 4. Cardiomegaly.   Electronically Signed   By: Jacqulynn Cadet M.D.   On: 04/21/2014 17:44    Impression:  The patient is recovering from preoperative stereotactic radiosurgery and craniotomy for his dominant brain metastasis. The patient now has remaining subcentimeter brain metastases which have not been treated. His options at this point include a wide variety of choices. His choices include comfort care with hospice, whole brain radiotherapy, and focused stereotactic radiosurgery to his brain metastases, and management of his bladder cancer with cystectomy or concurrent chemoradiotherapy., Or radiotherapy alone.  Plan:  Today, I spent significant time talking with the patient and his wife about the current situation. He understands that he has stage IV incurable bladder cancer which has spread to the brain. However, his bladder cancer has responded relatively well to neoadjuvant chemotherapy with no other extracranial metastatic disease noted. He has several brain metastases which may be amenable to stereotactic radiosurgery versus whole brain radiotherapy. We talked with the pros and cons of each of the various options available to him and ultimately we're  leaning towards remaining aggressive in the management of his cancer. Specifically, we will work towards stereotactic radiosurgery to his remaining untreated subcentimeter brain metastases and likely move towards concurrent chemoradiotherapy in the management of his bladder cancer, pending input from medical oncology.  _____________________________________  Sheral Apley Tammi Klippel, M.D.

## 2014-05-12 ENCOUNTER — Ambulatory Visit
Admission: RE | Admit: 2014-05-12 | Discharge: 2014-05-12 | Disposition: A | Discharge status: Home / Self Care | Payer: BLUE CROSS/BLUE SHIELD | Source: Ambulatory Visit | Attending: Radiation Oncology | Admitting: Radiation Oncology

## 2014-05-12 ENCOUNTER — Other Ambulatory Visit: Payer: Self-pay | Admitting: Oncology

## 2014-05-12 ENCOUNTER — Encounter: Payer: Self-pay | Admitting: Radiation Oncology

## 2014-05-12 ENCOUNTER — Other Ambulatory Visit: Payer: Self-pay | Admitting: Radiation Therapy

## 2014-05-12 ENCOUNTER — Telehealth: Payer: Self-pay | Admitting: Oncology

## 2014-05-12 VITALS — BP 128/81 | HR 96 | Temp 98.0°F | Resp 16 | Wt 194.6 lb

## 2014-05-12 DIAGNOSIS — C7931 Secondary malignant neoplasm of brain: Secondary | ICD-10-CM

## 2014-05-12 NOTE — Progress Notes (Signed)
Weight and vitals stable. Reports that he took his last decadron tablet this morning. Understands to contact this RN reference regression following completion of decadron. Surgical incision well healed without redness, drainage or edema. Site well approximated. Denies headache, dizziness, nausea or vomiting. Reports bowels are regular. Ritta Slot has resolved. Denies hiccups. Concerned about hoarseness since surgery. Denies sore throat or difficulty swallowing. Reports occasionally his "right hand draws up." Reports the top of his left foot often is sore. Denies difficulty walking. Reports that occasionally he will see floaters in his peripheral vision but, the of occurrence of these have greatly diminished.

## 2014-05-12 NOTE — Progress Notes (Signed)
Flushed power port per protocol. Patient tolerated well. Applied a bandaid to access site.

## 2014-05-12 NOTE — Telephone Encounter (Signed)
S/w pt's wife confirming MD visit per 02/24 POF..... KJ

## 2014-05-13 ENCOUNTER — Other Ambulatory Visit: Payer: Self-pay | Admitting: Radiation Therapy

## 2014-05-13 ENCOUNTER — Ambulatory Visit
Admission: RE | Admit: 2014-05-13 | Discharge: 2014-05-13 | Disposition: A | Discharge status: Home / Self Care | Payer: BLUE CROSS/BLUE SHIELD | Source: Ambulatory Visit | Attending: Radiation Oncology | Admitting: Radiation Oncology

## 2014-05-13 ENCOUNTER — Ambulatory Visit (HOSPITAL_BASED_OUTPATIENT_CLINIC_OR_DEPARTMENT_OTHER): Payer: BLUE CROSS/BLUE SHIELD | Admitting: Oncology

## 2014-05-13 VITALS — BP 120/79 | HR 101 | Temp 97.2°F | Resp 18 | Ht 66.0 in | Wt 195.5 lb

## 2014-05-13 DIAGNOSIS — D509 Iron deficiency anemia, unspecified: Secondary | ICD-10-CM

## 2014-05-13 DIAGNOSIS — M79602 Pain in left arm: Secondary | ICD-10-CM | POA: Diagnosis not present

## 2014-05-13 DIAGNOSIS — K5900 Constipation, unspecified: Secondary | ICD-10-CM | POA: Diagnosis not present

## 2014-05-13 DIAGNOSIS — M79601 Pain in right arm: Secondary | ICD-10-CM | POA: Diagnosis not present

## 2014-05-13 DIAGNOSIS — Z510 Encounter for antineoplastic radiation therapy: Secondary | ICD-10-CM | POA: Insufficient documentation

## 2014-05-13 DIAGNOSIS — L599 Disorder of the skin and subcutaneous tissue related to radiation, unspecified: Secondary | ICD-10-CM | POA: Diagnosis not present

## 2014-05-13 DIAGNOSIS — C672 Malignant neoplasm of lateral wall of bladder: Secondary | ICD-10-CM | POA: Diagnosis not present

## 2014-05-13 DIAGNOSIS — C679 Malignant neoplasm of bladder, unspecified: Secondary | ICD-10-CM

## 2014-05-13 DIAGNOSIS — C7989 Secondary malignant neoplasm of other specified sites: Secondary | ICD-10-CM

## 2014-05-13 DIAGNOSIS — C7931 Secondary malignant neoplasm of brain: Secondary | ICD-10-CM | POA: Diagnosis not present

## 2014-05-13 LAB — BUN AND CREATININE (CC13)
BUN: 21.1 mg/dL (ref 7.0–26.0)
Creatinine: 0.8 mg/dL (ref 0.7–1.3)
EGFR: >90 mL/min/{1.73_m2} (ref >90)

## 2014-05-13 NOTE — Progress Notes (Signed)
Hematology and Oncology Follow Up Visit  Erik Gross [id number] 1955/08/06 59 y.o. 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 He has a 5.3 x 4.0 x 5.6 cm diameter mass at the left aspect of the urinary bladder. This was initially noted back in December of 2014    Prior Therapy:  He underwent a TURBT and postoperative mitomycin-C on 02/25/2013. Retrograde pyelograms were negative at that time. He underwent a repeat TURBT of a more than a 5 cm large tumor and clot evacuation on 12/16/2013. The pathology showed a high-grade invasive transitional cell carcinoma. Neoadjuvant systemic chemotherapy with cisplatin and gemcitabine. Cisplatin and gemcitabine given on day 1 with gemcitabine given on day 8. He completed 3 cycles of chemotherapy in December 2015. He is status post right posterior temporal craniotomy and resection of brain metastasis done on 04/21/2014. He is status post SRS on 04/20/2014 for brain metastasis.  Current therapy: Under evaluation for definitive therapy for his bladder cancer.   Interim History: Mr. Egelston presents today for a followup visit accompanied by his wife. Since the last visit, he  developed worsening headaches and found to have brain metastasis as documented by an MRI which showed multiple metastatic lesions the largest of which is right posterior temporal mass. He underwent craniotomy and the pathology confirmed the presence of transitional cell carcinoma. His staging workup did not show any evidence of other systemic disease. He also had SRS of the brain on 04/20/2014. He was on dexamethasone and has been recently weaned. He is not reporting any headaches or blurry vision or syncope. Does not report any weakness or unsteadiness. Does not report any seizures. He does not report any fevers or chills or sweats or loss.  He does not report any abdominal pain. He does not report any chest pain or palpitation or orthopnea. He did not report any wheezing or cough or hemoptysis.  He did not report any flank pain or pelvic pain. He does not report any lower extremity edema or change in his mentation. He does not report any skeletal complaints. He continues to be in excellent health and excellent performance status. Remainder of his review of systems unremarkable.   Medications: I have reviewed the patient's current medications.  Current Outpatient Prescriptions  Medication Sig Dispense Refill  . acetaminophen (TYLENOL) 500 MG tablet Take 500 mg by mouth every 4 (four) hours as needed for mild pain or headache.     . bisacodyl (STIMULANT LAXATIVE) 5 MG EC tablet Take 10 mg by mouth daily as needed for mild constipation.     . docusate sodium (COLACE) 100 MG capsule Take 1 capsule (100 mg total) by mouth 2 (two) times daily as needed (take to keep stool soft.). (Patient not taking: Reported on 05/12/2014) 60 capsule 0  . lidocaine-prilocaine (EMLA) cream Apply 1 application topically as needed. Apply to port on the day of chemotherapy. (Patient not taking: Reported on 04/15/2014) 30 g 0  . Multiple Vitamin (MULTIVITAMIN) tablet Take 1 tablet by mouth daily.    . ondansetron (ZOFRAN) 8 MG tablet Take 1 tablet (8 mg total) by mouth every 8 (eight) hours as needed for nausea or vomiting. (Patient not taking: Reported on 04/15/2014) 30 tablet 1  . oxyCODONE (ROXICODONE) 5 MG immediate release tablet Take 1 tablet (5 mg total) by mouth every 4 (four) hours as needed for moderate  pain or severe pain. (Patient not taking: Reported on 05/12/2014) 30 tablet 0  . polyethylene glycol (MIRALAX / GLYCOLAX) packet Take 17 g by mouth daily.    . polyethylene glycol powder (GLYCOLAX/MIRALAX) powder Take by mouth.    . prochlorperazine (COMPAZINE) 10 MG tablet Take 1 tablet (10 mg total) by mouth every 6 (six) hours as needed for nausea or  vomiting. (Patient not taking: Reported on 04/15/2014) 30 tablet 1  . ranitidine (ZANTAC) 75 MG tablet Take 75 mg by mouth 2 (two) times daily.    . TOVIAZ 4 MG TB24 tablet Take 4 mg by mouth daily.  3  . TURMERIC PO Take 1 tablet by mouth daily.    . vitamin B-12 (CYANOCOBALAMIN) 1000 MCG tablet Take 1,000 mcg by mouth daily.    . Wheat Dextrin (BENEFIBER DRINK MIX PO) Take 1 packet by mouth daily.     Marland Kitchen zolpidem (AMBIEN) 5 MG tablet Take 1 tablet (5 mg total) by mouth at bedtime as needed for sleep. (Patient not taking: Reported on 04/15/2014) 30 tablet 0   No current facility-administered medications for this visit.     Allergies:  Allergies  Allergen Reactions  . Emend [Fosaprepitant] Shortness Of Breath and Other (See Comments)    Bright red face.    Past Medical History, Surgical history, Social history, and Family History were reviewed and updated.   Physical Exam: Blood pressure 120/79, pulse 101, temperature 97.2 F (36.2 C), temperature source Oral, resp. rate 18, height 5\' 6"  (1.676 m), weight 195 lb 8 oz (88.678 kg). ECOG: 0 General appearance: alert and cooperative Head: Normocephalic, without obvious abnormality Neck: no adenopathy Lymph nodes: Cervical, supraclavicular, and axillary nodes normal. Heart:regular rate and rhythm, S1, S2 normal, no murmur, click, rub or gallop Lung:chest clear, no wheezing, rales, normal symmetric air entry Abdomen: soft, non-tender, without masses or organomegaly EXT:no erythema, induration, or nodules   Lab Results: Lab Results  Component Value Date   WBC 13.6* 04/21/2014   HGB 13.8 04/21/2014   HCT 41.7 04/21/2014   MCV 94.3 04/21/2014   PLT 216 04/21/2014     Chemistry      Component Value Date/Time   NA 138 04/21/2014 1116   NA 137 03/22/2014 1159   K 4.2 04/21/2014 1116   K 4.8 03/22/2014 1159   CL 104 04/21/2014 1116   CO2 23 04/21/2014 1116   CO2 28 03/22/2014 1159   BUN 24* 04/21/2014 1116   BUN 14.7  03/22/2014 1159   CREATININE 1.01 04/21/2014 1116   CREATININE 1.0 03/22/2014 1159      Component Value Date/Time   CALCIUM 9.2 04/21/2014 1116   CALCIUM 9.9 03/22/2014 1159   ALKPHOS 47 04/15/2014 0957   ALKPHOS 64 03/22/2014 1159   AST 20 04/15/2014 0957   AST 20 03/22/2014 1159   ALT 11 04/15/2014 0957   ALT 18 03/22/2014 1159   BILITOT 0.7 04/15/2014 0957   BILITOT 0.46 03/22/2014 1159       Impression and Plan:  59 year old gentleman with the following issues:  1. 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 He has a 5.3 x 4.0 x 5.6 cm diameter mass at the left aspect of the urinary bladder.   He is S/P neoadjuvant systemic chemotherapy with cisplatin and Gemzar. He tolerated the last chemotherapy well without any complications completing 3 cycles in December 2015. His CT scan images on 03/22/2014 shows significant improvement in the  bladder mass without any disease outside of the bladder. He did develop brain metastasis without any other measurable disease outside of his bladder. His case was discussed in the genitourinary multidisciplinary conference and the decision was to treat his bladder locally with radiation therapy. I discussed with him the role of radiosensitizing chemotherapy in the form of weekly carboplatin with radiation therapy. Complications includes nausea, vomiting, myelosuppression and renal insufficiency were discussed. I feel he should be able to tolerate this fairly well and we'll offer her local disease control long term.  I plan to treat him on a weekly basis to coincide with radiation to the bladder.  2. Iron deficiency anemia: his hemoglobin is much improved. This will be repeated with the start of chemotherapy.  3. IV access: Port-A-Cath in place without any complication. This will be used for the administration of chemotherapy.  4. Anti-emetics: He has Compazine and Zofran.   5. Brain  metastasis: Status post craniotomy and SRS on 04/20/2014. He is planning to have a repeat MRI and a repeat SRS before the start of his bladder radiation.  6. Followup: Will be is scheduled in March 2016 which will be subject to change depending on the start of chemotherapy.    [facility], MD 2/25/20163:45 PM

## 2014-05-19 ENCOUNTER — Other Ambulatory Visit: Payer: BLUE CROSS/BLUE SHIELD

## 2014-05-19 ENCOUNTER — Encounter (HOSPITAL_COMMUNITY): Admission: RE | Payer: Self-pay | Source: Ambulatory Visit

## 2014-05-19 ENCOUNTER — Inpatient Hospital Stay (HOSPITAL_COMMUNITY): Admission: RE | Admit: 2014-05-19 | Payer: BLUE CROSS/BLUE SHIELD | Source: Ambulatory Visit | Admitting: Urology

## 2014-05-19 SURGERY — CYSTECTOMY, COMPLETE
Anesthesia: General

## 2014-05-21 ENCOUNTER — Ambulatory Visit: Payer: BLUE CROSS/BLUE SHIELD | Admitting: Radiation Oncology

## 2014-05-24 ENCOUNTER — Ambulatory Visit: Payer: BLUE CROSS/BLUE SHIELD | Admitting: Radiation Oncology

## 2014-06-02 ENCOUNTER — Ambulatory Visit
Admission: RE | Admit: 2014-06-02 | Discharge: 2014-06-02 | Disposition: A | Discharge status: Home / Self Care | Payer: BLUE CROSS/BLUE SHIELD | Source: Ambulatory Visit | Attending: Radiation Oncology | Admitting: Radiation Oncology

## 2014-06-02 DIAGNOSIS — C7931 Secondary malignant neoplasm of brain: Secondary | ICD-10-CM

## 2014-06-02 MED ORDER — GADOBENATE DIMEGLUMINE 529 MG/ML IV SOLN
18.0000 mL | Freq: Once | INTRAVENOUS | Status: AC | PRN
  Administered 2014-06-02: 18 mL via INTRAVENOUS

## 2014-06-02 NOTE — Progress Notes (Signed)
New MRI shows 16 metastases including 5 new ones in 6 weeks.  I will discuss with patient tomorrow and re-visit the consideration of SRS versus whole brain irradiation.

## 2014-06-03 ENCOUNTER — Encounter: Payer: Self-pay | Admitting: Radiation Oncology

## 2014-06-03 ENCOUNTER — Ambulatory Visit
Admission: RE | Admit: 2014-06-03 | Discharge: 2014-06-03 | Disposition: A | Discharge status: Home / Self Care | Payer: BLUE CROSS/BLUE SHIELD | Source: Ambulatory Visit | Attending: Radiation Oncology | Admitting: Radiation Oncology

## 2014-06-03 ENCOUNTER — Ambulatory Visit: Admission: RE | Admit: 2014-06-03 | Payer: BLUE CROSS/BLUE SHIELD | Source: Ambulatory Visit

## 2014-06-03 VITALS — BP 117/82 | HR 100 | Temp 97.8°F | Resp 18 | Wt 198.4 lb

## 2014-06-03 DIAGNOSIS — C7931 Secondary malignant neoplasm of brain: Secondary | ICD-10-CM

## 2014-06-03 DIAGNOSIS — Z510 Encounter for antineoplastic radiation therapy: Secondary | ICD-10-CM | POA: Diagnosis not present

## 2014-06-03 NOTE — Progress Notes (Signed)
Radiation Oncology         [phone]) [phone] ________________________________  Name: Erik Gross MRN: [id number]  Date: 06/03/2014  DOB: [birth year]  Follow-Up Visit Note  CC: Rusty Aus., MD  Eustace Moore, MD  Diagnosis:   59 yo man with 5 brain metastases from metastatic bladder cancer s/p SRS on 04/20/2014 with pre-op 14Gy to a Right temporal 51 mm target and definitive 20 Gy to a Left Parietal 18 mm target and a Left Cerebellar 14 mm target    ICD-9-CM ICD-10-CM   1. Brain metastases 198.3 C79.31     Interval Since Last Radiation:  6  weeks  Narrative:  The patient returns today for routine follow-up.  Patient and wife here to discuss srs vs whole brain radiation. Wife provided this RN with copy of advance directive.  Patient concerned about cervical lymph nodes being enlarged. Weight and vitals stable. Denies pain. No headache,  no nausea, no vomiting.              ALLERGIES:  is allergic to [facility].  Meds: Current Outpatient Prescriptions  Medication Sig Dispense Refill  . acetaminophen (TYLENOL) 500 MG tablet Take 500 mg by mouth every 4 (four) hours as needed for mild pain or headache.     . bisacodyl (STIMULANT LAXATIVE) 5 MG EC tablet Take 10 mg by mouth daily as needed for mild constipation.     . docusate sodium (COLACE) 100 MG capsule Take 1 capsule (100 mg total) by mouth 2 (two) times daily as needed (take to keep stool soft.). (Patient not taking: Reported on 05/12/2014) 60 capsule 0  . lidocaine-prilocaine (EMLA) cream Apply 1 application topically as needed. Apply to port on the day of chemotherapy. 30 g 0  . ondansetron (ZOFRAN) 8 MG tablet Take 1 tablet (8 mg total) by mouth every 8 (eight) hours as needed for nausea or vomiting. 30 tablet 1  . polyethylene glycol (MIRALAX / GLYCOLAX) packet Take 17 g by mouth daily.    . prochlorperazine (COMPAZINE) 10 MG tablet Take 1 tablet (10 mg total) by mouth every 6 (six) hours as needed for nausea or vomiting. 30 tablet 1    . vitamin B-12 (CYANOCOBALAMIN) 1000 MCG tablet Take 1,000 mcg by mouth daily.     No current facility-administered medications for this encounter.    Physical Findings: The patient is in no acute distress. Patient is alert and oriented.  weight is 198 lb 6.4 oz (89.994 kg). His oral temperature is 97.8 F (36.6 C). His blood pressure is 117/82 and his pulse is 100. His respiration is 18 and oxygen saturation is 98%. . The patient does have a well intended well-healed right craniotomy incision. The patient super of your fossae are notable for some increased fat deposition in the cushingoid pattern. There is no appreciable lymphadenopathy in the neck. He is neurologically intact with articulate speech normal gait No significant changes.  Lab Findings: Lab Results  Component Value Date   WBC 13.6* 04/21/2014   WBC 6.2 03/22/2014   HGB 13.8 04/21/2014   HGB 11.7* 03/22/2014   HCT 41.7 04/21/2014   HCT 35.9* 03/22/2014   PLT 216 04/21/2014   PLT 285 03/22/2014    Lab Results  Component Value Date   NA 138 04/21/2014   NA 137 03/22/2014   K 4.2 04/21/2014   K 4.8 03/22/2014   CHLORIDE 101 03/22/2014   CO2 23 04/21/2014   CO2 28 03/22/2014   GLUCOSE 99 04/21/2014  GLUCOSE 88 03/22/2014   BUN 21.1 05/13/2014   BUN 24* 04/21/2014   CREATININE 0.8 05/13/2014   CREATININE 1.01 04/21/2014   BILITOT 0.7 04/15/2014   BILITOT 0.46 03/22/2014   ALKPHOS 47 04/15/2014   ALKPHOS 64 03/22/2014   AST 20 04/15/2014   AST 20 03/22/2014   ALT 11 04/15/2014   ALT 18 03/22/2014   PROT 6.4 04/15/2014   PROT 6.9 03/22/2014   ALBUMIN 3.6 04/15/2014   ALBUMIN 3.9 03/22/2014   CALCIUM 9.2 04/21/2014   CALCIUM 9.9 03/22/2014   ANIONGAP 11 04/21/2014   ANIONGAP 9 03/22/2014    Radiographic Findings: Mr Jeri Cos OE Contrast  06/02/2014   CLINICAL DATA:  Metastatic disease to brain. Bladder cancer. Postop tumor resection in February 2016. Radiation to brain in 2016.  Stereotactic  radiosurgery targeting  EXAM: MRI HEAD WITHOUT AND WITH CONTRAST  TECHNIQUE: Multiplanar, multiecho pulse sequences of the brain and surrounding structures were obtained without and with intravenous contrast.  CONTRAST:  79mL MULTIHANCE GADOBENATE DIMEGLUMINE 529 MG/ML IV SOLN  COMPARISON:  MRI head 04/22/2014  FINDINGS:  SRS protocol at 3 Tesla  Multiple metastatic deposits in the brain with progression. Multiple new lesions are present.  Post surgical cavity in the right posterior temporal lobe contains fluid and some chronic blood products. The cavity shows thick irregular enhancement of the wall suggestive of residual tumor. The cavity size has increased since prior study. Cavity now measures 5.3 x 4.0 cm compared to 3.8 x 4.4 cm previously. Large amount of surrounding white matter edema is similar.  Left medial parietal ring-enhancing lesion is slightly smaller measuring 15 x 13 mm. There is moderate surrounding edema which is unchanged in amount. Mild hemorrhage has increased in the interval.  Hemorrhagic lesion left posterior cerebellum is smaller and shows less enhancement compared to the prior study.  3 mm enhancing lesion left medial temporal lobe is new  3 mm right anterior frontal lobe enhancing lesion is new  4 mm left frontal lobe white matter lesion is new.  6 mm left frontal periventricular white matter lesion is new.  3 mm left frontal lesion anterior to the corpus callosum is new  Ring-enhancing lesion right parietal lobe axial image number 115 measures 13 mm and has increased in size. Increase in surrounding edema. This shows mild hemorrhage.  5 mm left frontal lesion axial image 117 has increased in size  3 mm left parietal lesion has increased in size  3 mm right parietal lesion has increased in size.  Axial image 3132  High right frontal lesion measuring 8 x 10 mm axial image 138 is increased in size.  8 mm left frontal lobe lesion over the convexity is increased in size  6 mm right frontal  lobe lesion over the convexity has increased in size  5 mm right frontal lesion anteriorly axial image 150 head is new of  Ventricle size is normal. Mild midline shift to the right has a improved since the prior study. Midline shift now measures approximately 5 mm.  Many of the metastatic deposit show restricted diffusion. No acute infarct identified.  IMPRESSION: Multiple enhancing brain lesions compatible with extensive metastatic disease. Many of these lesions have progressed or are new compared with the prior MRI.  Mild midline shift to the left has improved in the interval. There remains significant edema on the right. The post surgical cavity on the right has increased in size with irregular thickening of the wall suggestive of tumor.  Electronically Signed   By: Franchot Gallo M.D.   On: 06/02/2014 16:27    Impression:  The patient has received stereotactic radiosurgery to 3 intracranial brain metastases followed by surgical resection of the dominant lesion with significant edema. He is recovering uneventfully from surgery. He underwent a restaging brain MRI yesterday which shows a total of 16 brain metastasis including the 3 treated lesions. This represents 5 new lesions over the previous 6 weeks. There is also some question of residual enhancement surrounding the recent surgical cavity. At this point, the question is whether to pursue stereotactic radiosurgery to the 13 untreated brain metastases or given the pace of new metastatic development, should we consider whole brain radiotherapy.  Plan:  Today, I talked to the patient and his wife significantly about the recent MRI. We discussed the dilemma of stereotactic radiosurgery versus whole brain radiotherapy. The conversation was similar to her initial conversation however taking into account the development of multiple new brain metastases within 6 weeks. We discussed the pros and cons of stereotactic radiosurgery versus whole brain radiotherapy. We  discussed that one advantages treatment radiosurgery is reduced neurocognitive changes and avoiding any delay in systemic therapy for the bladder cancer. We talked about some of the benefits of whole brain radiotherapy including the cessation of new metastases while controlling the visible sites of disease. At this point, the patient would like to proceed with whole brain radiotherapy. He is also developed some increasing hematuria and we will proceed with radiotherapy to the brain and bladder at this time. Once his whole brain radiotherapy is complete, he will be eligible work to return for systemic chemotherapy either concurrently with bladder or radiation or following radiotherapy.  _____________________________________  Sheral Apley. Tammi Klippel, M.D.

## 2014-06-03 NOTE — Progress Notes (Signed)
3.17.16:  Rec'd [place] paperwork from patient.  Placed in orange folder and forward to RN for processing.

## 2014-06-03 NOTE — Progress Notes (Signed)
Patient and wife here to discuss srs vs whole brain radiation. Wife provided this RN with copy of advance directive. This RN will send this copy to front office to scan into the patient's permanent chart. Patient concerned about cervical lymph nodes being enlarged. Weight and vitals stable. Denies pain.

## 2014-06-04 ENCOUNTER — Telehealth: Payer: Self-pay | Admitting: Radiation Oncology

## 2014-06-04 ENCOUNTER — Ambulatory Visit
Admission: RE | Admit: 2014-06-04 | Payer: BLUE CROSS/BLUE SHIELD | Source: Ambulatory Visit | Admitting: Radiation Oncology

## 2014-06-04 ENCOUNTER — Other Ambulatory Visit: Payer: Self-pay | Admitting: Radiation Oncology

## 2014-06-04 ENCOUNTER — Ambulatory Visit: Payer: BLUE CROSS/BLUE SHIELD | Admitting: Radiation Oncology

## 2014-06-04 ENCOUNTER — Telehealth: Payer: Self-pay | Admitting: *Deleted

## 2014-06-04 ENCOUNTER — Encounter: Payer: Self-pay | Admitting: Radiation Oncology

## 2014-06-04 DIAGNOSIS — C672 Malignant neoplasm of lateral wall of bladder: Secondary | ICD-10-CM

## 2014-06-04 DIAGNOSIS — Z510 Encounter for antineoplastic radiation therapy: Secondary | ICD-10-CM | POA: Diagnosis not present

## 2014-06-04 DIAGNOSIS — C7931 Secondary malignant neoplasm of brain: Secondary | ICD-10-CM

## 2014-06-04 NOTE — Telephone Encounter (Signed)
Placed completed disability paperwork in Dr. Johny Shears inbox for him to sign.

## 2014-06-04 NOTE — Telephone Encounter (Signed)
CALLED PATIENT TO INFORM OF CT ON 06-07-14- ARRIVAL TIME - 3:15 PM @ [facility], LVM FOR A RETURN CALL

## 2014-06-07 ENCOUNTER — Ambulatory Visit (HOSPITAL_COMMUNITY): Admission: RE | Admit: 2014-06-07 | Payer: BLUE CROSS/BLUE SHIELD | Source: Ambulatory Visit

## 2014-06-07 ENCOUNTER — Ambulatory Visit
Admission: RE | Admit: 2014-06-07 | Discharge: 2014-06-07 | Disposition: A | Discharge status: Home / Self Care | Payer: BLUE CROSS/BLUE SHIELD | Source: Ambulatory Visit | Attending: Radiation Oncology | Admitting: Radiation Oncology

## 2014-06-07 ENCOUNTER — Telehealth: Payer: Self-pay | Admitting: *Deleted

## 2014-06-07 DIAGNOSIS — Z510 Encounter for antineoplastic radiation therapy: Secondary | ICD-10-CM | POA: Diagnosis not present

## 2014-06-07 NOTE — Telephone Encounter (Signed)
CALLED PATIENT TO INFORM OF CT BEING MOVED TO 06/28/14 @ 3 PM @ [facility], SPOKE WITH PATIENT'S WIFE - SHERRY AND SHE IS AWARE OF THIS TEST

## 2014-06-08 ENCOUNTER — Ambulatory Visit
Admission: RE | Admit: 2014-06-08 | Discharge: 2014-06-08 | Disposition: A | Discharge status: Home / Self Care | Payer: BLUE CROSS/BLUE SHIELD | Source: Ambulatory Visit | Attending: Radiation Oncology | Admitting: Radiation Oncology

## 2014-06-08 DIAGNOSIS — Z510 Encounter for antineoplastic radiation therapy: Secondary | ICD-10-CM | POA: Diagnosis not present

## 2014-06-09 ENCOUNTER — Telehealth: Payer: Self-pay | Admitting: Radiation Oncology

## 2014-06-09 ENCOUNTER — Telehealth: Payer: Self-pay | Admitting: Oncology

## 2014-06-09 ENCOUNTER — Ambulatory Visit (HOSPITAL_BASED_OUTPATIENT_CLINIC_OR_DEPARTMENT_OTHER): Payer: BLUE CROSS/BLUE SHIELD

## 2014-06-09 ENCOUNTER — Ambulatory Visit (HOSPITAL_BASED_OUTPATIENT_CLINIC_OR_DEPARTMENT_OTHER): Payer: BLUE CROSS/BLUE SHIELD | Admitting: Oncology

## 2014-06-09 ENCOUNTER — Other Ambulatory Visit: Payer: BLUE CROSS/BLUE SHIELD

## 2014-06-09 ENCOUNTER — Encounter: Payer: Self-pay | Admitting: Radiation Oncology

## 2014-06-09 ENCOUNTER — Other Ambulatory Visit (HOSPITAL_BASED_OUTPATIENT_CLINIC_OR_DEPARTMENT_OTHER): Payer: BLUE CROSS/BLUE SHIELD

## 2014-06-09 ENCOUNTER — Ambulatory Visit
Admission: RE | Admit: 2014-06-09 | Discharge: 2014-06-09 | Disposition: A | Discharge status: Home / Self Care | Payer: BLUE CROSS/BLUE SHIELD | Source: Ambulatory Visit | Attending: Radiation Oncology | Admitting: Radiation Oncology

## 2014-06-09 VITALS — BP 118/78 | HR 101 | Temp 97.9°F | Resp 19 | Ht 66.0 in | Wt 198.3 lb

## 2014-06-09 DIAGNOSIS — Z510 Encounter for antineoplastic radiation therapy: Secondary | ICD-10-CM | POA: Diagnosis not present

## 2014-06-09 DIAGNOSIS — C679 Malignant neoplasm of bladder, unspecified: Secondary | ICD-10-CM

## 2014-06-09 DIAGNOSIS — C7931 Secondary malignant neoplasm of brain: Secondary | ICD-10-CM | POA: Diagnosis not present

## 2014-06-09 DIAGNOSIS — D509 Iron deficiency anemia, unspecified: Secondary | ICD-10-CM | POA: Diagnosis not present

## 2014-06-09 DIAGNOSIS — Z95828 Presence of other vascular implants and grafts: Secondary | ICD-10-CM

## 2014-06-09 DIAGNOSIS — C670 Malignant neoplasm of trigone of bladder: Secondary | ICD-10-CM

## 2014-06-09 LAB — CBC WITH DIFFERENTIAL/PLATELET
BASO%: 0.6 % (ref 0.0–2.0)
BASOS ABS: 0.1 10*3/uL (ref 0.0–0.1)
EOS ABS: 0.3 10*3/uL (ref 0.0–0.5)
EOS%: 2.6 % (ref 0.0–7.0)
HCT: 33.2 % — ABNORMAL LOW (ref 38.4–49.9)
HEMOGLOBIN: 10.8 g/dL — AB (ref 13.0–17.1)
LYMPH#: 1 10*3/uL (ref 0.9–3.3)
LYMPH%: 9.8 % — AB (ref 14.0–49.0)
MCH: 29.2 pg (ref 27.2–33.4)
MCHC: 32.6 g/dL (ref 32.0–36.0)
MCV: 89.7 fL (ref 79.3–98.0)
MONO#: 0.9 10*3/uL (ref 0.1–0.9)
MONO%: 8.2 % (ref 0.0–14.0)
NEUT#: 8.2 10*3/uL — ABNORMAL HIGH (ref 1.5–6.5)
NEUT%: 78.8 % — AB (ref 39.0–75.0)
PLATELETS: 344 10*3/uL (ref 140–400)
RBC: 3.7 10*6/uL — ABNORMAL LOW (ref 4.20–5.82)
RDW: 17.6 % — ABNORMAL HIGH (ref 11.0–14.6)
WBC: 10.4 10*3/uL — ABNORMAL HIGH (ref 4.0–10.3)

## 2014-06-09 LAB — COMPREHENSIVE METABOLIC PANEL (CC13)
ALT: 15 U/L (ref 0–55)
ANION GAP: 13 meq/L — AB (ref 3–11)
AST: 15 U/L (ref 5–34)
Albumin: 3.1 g/dL — ABNORMAL LOW (ref 3.5–5.0)
Alkaline Phosphatase: 63 U/L (ref 40–150)
BILIRUBIN TOTAL: 0.61 mg/dL (ref 0.20–1.20)
BUN: 10.9 mg/dL (ref 7.0–26.0)
CALCIUM: 9.1 mg/dL (ref 8.4–10.4)
CHLORIDE: 103 meq/L (ref 98–109)
CO2: 23 meq/L (ref 22–29)
CREATININE: 0.8 mg/dL (ref 0.7–1.3)
EGFR: >90 mL/min/{1.73_m2} (ref >90)
Glucose: 81 mg/dl (ref 70–140)
Potassium: 3.9 mEq/L (ref 3.5–5.1)
Sodium: 139 mEq/L (ref 136–145)
Total Protein: 7 g/dL (ref 6.4–8.3)

## 2014-06-09 MED ORDER — HEPARIN SOD (PORK) LOCK FLUSH 100 UNIT/ML IV SOLN
500.0000 [IU] | Freq: Once | INTRAVENOUS | Status: AC
  Administered 2014-06-09: 500 [IU] via INTRAVENOUS
  Filled 2014-06-09: qty 5

## 2014-06-09 MED ORDER — SODIUM CHLORIDE 0.9 % IJ SOLN
10.0000 mL | INTRAMUSCULAR | Status: DC | PRN
  Administered 2014-06-09: 10 mL via INTRAVENOUS
  Filled 2014-06-09: qty 10

## 2014-06-09 NOTE — Progress Notes (Signed)
Hematology and Oncology Follow Up Visit  Erik Gross [id number] 07/25/1955 59 y.o. 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 He has a 5.3 x 4.0 x 5.6 cm diameter mass at the left aspect of the urinary bladder. This was initially noted back in December of 2014    Prior Therapy:  He underwent a TURBT and postoperative mitomycin-C on 02/25/2013. Retrograde pyelograms were negative at that time. He underwent a repeat TURBT of a more than a 5 cm large tumor and clot evacuation on 12/16/2013. The pathology showed a high-grade invasive transitional cell carcinoma. Neoadjuvant systemic chemotherapy with cisplatin and gemcitabine. Cisplatin and gemcitabine given on day 1 with gemcitabine given on day 8. He completed 3 cycles of chemotherapy in December 2015. He is status post right posterior temporal craniotomy and resection of brain metastasis done on 04/21/2014. He is status post SRS on 04/20/2014 for brain metastasis.  Current therapy:  Whole brain radiation therapy started on 06/07/2014. External beam radiation to the bladder started on 06/07/2014.   Interim History: Erik Gross presents today for a followup visit accompanied by his wife. Since the last visit, he had a repeat MRI on 06/02/2014 which showed multiple new brain lesions. Since that time, he started whole brain radiation as well as bladder radiation. He has reported excessive fatigue and tiredness associated with that. Has not reported any new complaints. He still have some slight hematuria associated with urination.  He is not reporting any headaches or blurry vision or syncope. Does not report any weakness or unsteadiness. Does not report any seizures. He does not report any fevers or chills or sweats or loss. He does not report any abdominal pain. He does not report any chest pain or  palpitation or orthopnea. He did not report any wheezing or cough or hemoptysis.  He did not report any flank pain or pelvic pain. He does not report any lower extremity edema or change in his mentation. He does not report any skeletal complaints. He continues to be in excellent health and excellent performance status. Remainder of his review of systems unremarkable.   Medications: I have reviewed the patient's current medications.  Current Outpatient Prescriptions  Medication Sig Dispense Refill  . acetaminophen (TYLENOL) 500 MG tablet Take 500 mg by mouth every 4 (four) hours as needed for mild pain or headache.     . bisacodyl (STIMULANT LAXATIVE) 5 MG EC tablet Take 10 mg by mouth daily as needed for mild constipation.     . docusate sodium (COLACE) 100 MG capsule Take 1 capsule (100 mg total) by mouth 2 (two) times daily as needed (take to keep stool soft.). 60 capsule 0  . lidocaine-prilocaine (EMLA) cream Apply 1 application topically as needed. Apply to port on the day of chemotherapy. 30 g 0  . ondansetron (ZOFRAN) 8 MG tablet Take 1 tablet (8 mg total) by mouth every 8 (eight) hours as needed for nausea or vomiting. 30 tablet 1  . polyethylene glycol (MIRALAX / GLYCOLAX) packet Take 17 g by mouth daily.    . prochlorperazine (COMPAZINE) 10 MG tablet Take 1 tablet (10 mg total) by mouth every 6 (six) hours as needed for nausea or vomiting. 30 tablet 1  . vitamin B-12 (CYANOCOBALAMIN) 1000 MCG tablet Take 1,000 mcg by mouth daily.     No current facility-administered medications for this visit.  Allergies:  Allergies  Allergen Reactions  . Emend [Fosaprepitant] Shortness Of Breath and Other (See Comments)    Bright red face.    Past Medical History, Surgical history, Social history, and Family History were reviewed and updated.   Physical Exam: Blood pressure 118/78, pulse 101, temperature 97.9 F (36.6 C), temperature source Oral, resp. rate 19, height 5\' 6"  (1.676 m),  weight 198 lb 4.8 oz (89.948 kg), SpO2 100 %. ECOG: 0 General appearance: alert and cooperative Head: Normocephalic, without obvious abnormality Neck: no adenopathy. Fat pad noted bilaterally. Lymph nodes: Cervical, supraclavicular, and axillary nodes normal. Heart:regular rate and rhythm, S1, S2 normal, no murmur, click, rub or gallop Lung:chest clear, no wheezing, rales, normal symmetric air entry Abdomen: soft, non-tender, without masses or organomegaly EXT:no erythema, induration, or nodules   Lab Results: Lab Results  Component Value Date   WBC 10.4* 06/09/2014   HGB 10.8* 06/09/2014   HCT 33.2* 06/09/2014   MCV 89.7 06/09/2014   PLT 344 06/09/2014     Chemistry      Component Value Date/Time   NA 139 06/09/2014 1233   NA 138 04/21/2014 1116   K 3.9 06/09/2014 1233   K 4.2 04/21/2014 1116   CL 104 04/21/2014 1116   CO2 23 06/09/2014 1233   CO2 23 04/21/2014 1116   BUN 10.9 06/09/2014 1233   BUN 24* 04/21/2014 1116   CREATININE 0.8 06/09/2014 1233   CREATININE 1.01 04/21/2014 1116      Component Value Date/Time   CALCIUM 9.1 06/09/2014 1233   CALCIUM 9.2 04/21/2014 1116   ALKPHOS 63 06/09/2014 1233   ALKPHOS 47 04/15/2014 0957   AST 15 06/09/2014 1233   AST 20 04/15/2014 0957   ALT 15 06/09/2014 1233   ALT 11 04/15/2014 0957   BILITOT 0.61 06/09/2014 1233   BILITOT 0.7 04/15/2014 0957       Impression and Plan:  59 year old gentleman with the following issues:  1. 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 He has a 5.3 x 4.0 x 5.6 cm diameter mass at the left aspect of the urinary bladder.   He is S/P neoadjuvant systemic chemotherapy with cisplatin and Gemzar. He tolerated the last chemotherapy well without any complications completing 3 cycles in December 2015. His CT scan images on 03/22/2014 shows significant improvement in the bladder mass without any disease outside of the bladder. He did  develop brain metastasis without any other measurable disease outside of his bladder.   He is currently getting definitive radiation therapy to the bladder as well as whole brain radiation. After discussion today, we have elected to defer concomitant chemotherapy till after radiation therapy has been completed. We have considered adding chemotherapy after the whole brain radiation is complete but after discussing the risks and benefits will have deferred chemotherapy until he is done with the radiation course completely. I will schedule him a CT scan for staging workup and determine further chemotherapy depending on the results of the staging CT scan.    2. Iron deficiency anemia: his hemoglobin is much improved.   3. IV access: Port-A-Cath in place without any complication. This will be flushed intermittently.  4. Anti-emetics: He has Compazine and Zofran.   5. Brain metastasis: Status post craniotomy and SRS on 04/20/2014. He is undergoing whole brain radiation therapy.  6. Followup: Will be is May 2016 after completing radiation therapy to discuss potential need for systemic therapy.  Zola Button, MD 3/23/20161:22 PM

## 2014-06-09 NOTE — Patient Instructions (Signed)

## 2014-06-09 NOTE — Progress Notes (Signed)
  Radiation Oncology         [phone]) [phone] ________________________________  Name: Erik Gross MRN: [id number]  Date: 06/10/2014  DOB: [birth year]  Weekly Radiation Therapy Management    ICD-9-CM ICD-10-CM   1. Brain metastases 198.3 C79.31 topical emolient (BIAFINE) emulsion  2. Malignant neoplasm of lateral wall of urinary bladder 188.2 C67.2     Current Dose: 7.2 Gy     Planned Dose:  45 Gy  Narrative . . . . . . . . The patient presents for routine under treatment assessment.                                   The patient is without complaint.                                 Set-up films were reviewed.                                 The chart was checked. Physical Findings. . .  weight is 197 lb 1.6 oz (89.404 kg). His oral temperature is 98 F (36.7 C). His blood pressure is 130/79 and his pulse is 103. His respiration is 16 and oxygen saturation is 100%. . Weight essentially stable.  No significant changes. Impression . . . . . . . The patient is tolerating radiation. Plan . . . . . . . . . . . . Continue treatment as planned.  ________________________________  Sheral Apley. Tammi Klippel, M.D.

## 2014-06-09 NOTE — Telephone Encounter (Signed)
Phoned patient's wife, [name]. Requested they present to see Dr. Tammi Klippel at [facility] prior to tx. Wife verbalized understanding and confirmed they would be present. She questioned multiple orders for CT. Tammi Klippel has CT scheduled for 4/11 and Alen Blew is order additional ones for May. Reports her husband's depressed mood is slowly improving. Reinforced chaplain services and social work services are available free of charge for an extra layer of support should they decide this is needed. She verbalized understanding.

## 2014-06-09 NOTE — Progress Notes (Signed)
Rec'd completed [place] paperwork from RN/Physician.  Made copy for scanning.  Faxed completed form to [facility] at [phone].  Put originals in folder to return to patient.

## 2014-06-09 NOTE — Telephone Encounter (Signed)
Pt confirmed labs/ov per 03/23 POF, gave pt AVS and calendar..... KJ, gave pt barium

## 2014-06-10 ENCOUNTER — Encounter: Payer: Self-pay | Admitting: Radiation Oncology

## 2014-06-10 ENCOUNTER — Ambulatory Visit
Admission: RE | Admit: 2014-06-10 | Discharge: 2014-06-10 | Disposition: A | Discharge status: Home / Self Care | Payer: BLUE CROSS/BLUE SHIELD | Source: Ambulatory Visit | Attending: Radiation Oncology | Admitting: Radiation Oncology

## 2014-06-10 ENCOUNTER — Ambulatory Visit: Payer: BLUE CROSS/BLUE SHIELD | Admitting: Radiation Oncology

## 2014-06-10 VITALS — BP 130/79 | HR 103 | Temp 98.0°F | Resp 16 | Wt 197.1 lb

## 2014-06-10 DIAGNOSIS — C7931 Secondary malignant neoplasm of brain: Secondary | ICD-10-CM

## 2014-06-10 DIAGNOSIS — C672 Malignant neoplasm of lateral wall of bladder: Secondary | ICD-10-CM

## 2014-06-10 DIAGNOSIS — Z510 Encounter for antineoplastic radiation therapy: Secondary | ICD-10-CM | POA: Diagnosis not present

## 2014-06-10 MED ORDER — BIAFINE EX EMUL
Freq: Every day | CUTANEOUS | Status: DC
  Administered 2014-06-10: 12:00:00 via TOPICAL

## 2014-06-10 NOTE — Progress Notes (Signed)
Report routine mild headaches which resolved with Tylenol. Weight and vitals stable. Denies diarrhea. Reports mild dysuria. Reports traces of dry hematuria. Denies dizziness. Reports nausea. Denies vomiting. Reports taking zofran. Reports buzzing in right ear. Reports occasional floaters.

## 2014-06-10 NOTE — Progress Notes (Signed)
Oriented patient and his daughter to staff and routine of the clinic. Provided patient with RADIATION THERAPY AND YOU handbook then, reviewed pertinent information. Educated patient reference potential side effects and management such as, fatigue, skin changes, nausea, diarrhea, and urinary bladder changes. Provided patient with Biafine cream and directed upon use. Patient verbalized understanding of all reviewed.

## 2014-06-11 ENCOUNTER — Ambulatory Visit
Admission: RE | Admit: 2014-06-11 | Discharge: 2014-06-11 | Disposition: A | Discharge status: Home / Self Care | Payer: BLUE CROSS/BLUE SHIELD | Source: Ambulatory Visit | Attending: Radiation Oncology | Admitting: Radiation Oncology

## 2014-06-11 DIAGNOSIS — Z510 Encounter for antineoplastic radiation therapy: Secondary | ICD-10-CM | POA: Diagnosis not present

## 2014-06-14 ENCOUNTER — Ambulatory Visit
Admission: RE | Admit: 2014-06-14 | Discharge: 2014-06-14 | Disposition: A | Discharge status: Home / Self Care | Payer: BLUE CROSS/BLUE SHIELD | Source: Ambulatory Visit | Attending: Radiation Oncology | Admitting: Radiation Oncology

## 2014-06-14 DIAGNOSIS — Z510 Encounter for antineoplastic radiation therapy: Secondary | ICD-10-CM | POA: Diagnosis not present

## 2014-06-15 ENCOUNTER — Ambulatory Visit
Admission: RE | Admit: 2014-06-15 | Discharge: 2014-06-15 | Disposition: A | Discharge status: Home / Self Care | Payer: BLUE CROSS/BLUE SHIELD | Source: Ambulatory Visit | Attending: Radiation Oncology | Admitting: Radiation Oncology

## 2014-06-15 DIAGNOSIS — Z510 Encounter for antineoplastic radiation therapy: Secondary | ICD-10-CM | POA: Diagnosis not present

## 2014-06-16 ENCOUNTER — Ambulatory Visit
Admission: RE | Admit: 2014-06-16 | Discharge: 2014-06-16 | Disposition: A | Discharge status: Home / Self Care | Payer: BLUE CROSS/BLUE SHIELD | Source: Ambulatory Visit | Attending: Radiation Oncology | Admitting: Radiation Oncology

## 2014-06-16 DIAGNOSIS — Z510 Encounter for antineoplastic radiation therapy: Secondary | ICD-10-CM | POA: Diagnosis not present

## 2014-06-17 ENCOUNTER — Ambulatory Visit
Admission: RE | Admit: 2014-06-17 | Discharge: 2014-06-17 | Disposition: A | Discharge status: Home / Self Care | Payer: BLUE CROSS/BLUE SHIELD | Source: Ambulatory Visit | Attending: Radiation Oncology | Admitting: Radiation Oncology

## 2014-06-17 ENCOUNTER — Telehealth: Payer: Self-pay | Admitting: Radiation Oncology

## 2014-06-17 ENCOUNTER — Telehealth: Payer: Self-pay | Admitting: *Deleted

## 2014-06-17 DIAGNOSIS — Z510 Encounter for antineoplastic radiation therapy: Secondary | ICD-10-CM | POA: Diagnosis not present

## 2014-06-17 NOTE — Telephone Encounter (Signed)
Returning message left on voicemail by patient's wife. She reports her husband push mowed their lawn on Saturday and has complained of aching elbows and hands ever since. Confirmed these symptoms are not side effects associated with radiation therapy. She reports that her husband has been taking Tylenol without relief. Patient has hematuria therefore aleve is contraindicated. Encouraged rest and alternating hot/cold. She verbalized understanding. Explained that Dr. Isidore Moos plans to see this patient for PUT on Friday and could discuss this matter further then. Advised if symptoms became worse to contact PCP or Selena Lesser, NP. Wife verbalized understanding and expressed appreciation for return call.

## 2014-06-17 NOTE — Telephone Encounter (Signed)
Wife Erik Gross calling to say patient is having pain in his hands,arms and elbows since mowing the yard last Saturday. Tylenol ATC is not helping. Per dr Alen Blew, okay to try aleve or advil, if no results, call back tomorrow. [name] verbalized understanding.

## 2014-06-18 ENCOUNTER — Ambulatory Visit: Payer: BLUE CROSS/BLUE SHIELD

## 2014-06-18 ENCOUNTER — Ambulatory Visit: Payer: BLUE CROSS/BLUE SHIELD | Admitting: Radiation Oncology

## 2014-06-21 ENCOUNTER — Ambulatory Visit
Admission: RE | Admit: 2014-06-21 | Discharge: 2014-06-21 | Disposition: A | Discharge status: Home / Self Care | Payer: BLUE CROSS/BLUE SHIELD | Source: Ambulatory Visit | Attending: Radiation Oncology | Admitting: Radiation Oncology

## 2014-06-21 ENCOUNTER — Encounter: Payer: Self-pay | Admitting: Radiation Oncology

## 2014-06-21 VITALS — BP 126/98 | HR 72 | Temp 97.9°F | Resp 16 | Wt 196.4 lb

## 2014-06-21 DIAGNOSIS — C7931 Secondary malignant neoplasm of brain: Secondary | ICD-10-CM

## 2014-06-21 DIAGNOSIS — Z510 Encounter for antineoplastic radiation therapy: Secondary | ICD-10-CM | POA: Diagnosis not present

## 2014-06-21 MED ORDER — GABAPENTIN 300 MG PO CAPS: 300.0000 mg | ORAL_CAPSULE | Freq: Three times a day (TID) | ORAL | Status: AC

## 2014-06-21 NOTE — Progress Notes (Signed)
Reports alternating advil and oxycodone for bilateral upper extremity pain. Describes pain in both arms as tingly and achy. Reports intermittent headaches. Reports his scalp is tender. Reports night sweats. Concerned about chills. Hyperpigmentation and dry desquamation of scalp noted. Reports fatigue. States, "I feel terrible all the time." reports mild dysuria. Denies diarrhea. Reports nocturia x 5.

## 2014-06-21 NOTE — Progress Notes (Signed)
  Radiation Oncology         [phone]) [phone] ________________________________  Name: Erik Gross MRN: [id number]  Date: 06/21/2014  DOB: 12-29-1955  Weekly Radiation Therapy Management    ICD-9-CM ICD-10-CM   1. Brain metastases 198.3 C79.31     Current Dose: 18 Gy     Planned Dose:  45 Gy  Narrative . . . . . . . . The patient presents for routine under treatment assessment.                                   Reports alternating advil and oxycodone for bilateral upper extremity pain. Describes pain in both arms as tingly and achy. Reports intermittent headaches. Reports his scalp is tender. Reports night sweats. Concerned about chills. Hyperpigmentation and dry desquamation of scalp noted. Reports fatigue. States, "I feel terrible all the time." reports mild dysuria. Denies diarrhea. Reports nocturia x 5.                                  Set-up films were reviewed.                                 The chart was checked. Physical Findings. . .  weight is 196 lb 6.4 oz (89.086 kg). His oral temperature is 97.9 F (36.6 C). His blood pressure is 126/98 and his pulse is 72. His respiration is 16 and oxygen saturation is 100%. . Weight essentially stable.  No significant changes. Impression . . . . . . . The patient is tolerating radiation. Plan . . . . . . . . . . . . Continue treatment as planned.  Add Neurontin.  Re-staging next week to determine radiation dose to bladder.  If widespread metastases, will stop early to proceed with chemo.  If no significant disease beyond brain and bladder, would suggest more definitive dose for bladder.  ________________________________  Sheral Apley. Tammi Klippel, M.D.

## 2014-06-21 NOTE — Progress Notes (Signed)
Wife concerned about CT scan scheduled for 4/11 and again in May. Wants to get one scan that will benefit both physicians.

## 2014-06-22 ENCOUNTER — Ambulatory Visit
Admission: RE | Admit: 2014-06-22 | Discharge: 2014-06-22 | Disposition: A | Discharge status: Home / Self Care | Payer: BLUE CROSS/BLUE SHIELD | Source: Ambulatory Visit | Attending: Radiation Oncology | Admitting: Radiation Oncology

## 2014-06-22 DIAGNOSIS — Z510 Encounter for antineoplastic radiation therapy: Secondary | ICD-10-CM | POA: Diagnosis not present

## 2014-06-23 ENCOUNTER — Ambulatory Visit
Admission: RE | Admit: 2014-06-23 | Discharge: 2014-06-23 | Disposition: A | Discharge status: Home / Self Care | Payer: BLUE CROSS/BLUE SHIELD | Source: Ambulatory Visit | Attending: Radiation Oncology | Admitting: Radiation Oncology

## 2014-06-23 DIAGNOSIS — Z510 Encounter for antineoplastic radiation therapy: Secondary | ICD-10-CM | POA: Diagnosis not present

## 2014-06-24 ENCOUNTER — Ambulatory Visit
Admission: RE | Admit: 2014-06-24 | Discharge: 2014-06-24 | Disposition: A | Discharge status: Home / Self Care | Payer: BLUE CROSS/BLUE SHIELD | Source: Ambulatory Visit | Attending: Radiation Oncology | Admitting: Radiation Oncology

## 2014-06-24 DIAGNOSIS — Z510 Encounter for antineoplastic radiation therapy: Secondary | ICD-10-CM | POA: Diagnosis not present

## 2014-06-25 ENCOUNTER — Ambulatory Visit
Admission: RE | Admit: 2014-06-25 | Discharge: 2014-06-25 | Disposition: A | Discharge status: Home / Self Care | Payer: BLUE CROSS/BLUE SHIELD | Source: Ambulatory Visit | Attending: Radiation Oncology | Admitting: Radiation Oncology

## 2014-06-25 ENCOUNTER — Encounter: Payer: Self-pay | Admitting: Radiation Oncology

## 2014-06-25 VITALS — BP 134/68 | HR 107 | Temp 98.3°F | Resp 20 | Ht 66.0 in | Wt 197.5 lb

## 2014-06-25 DIAGNOSIS — Z510 Encounter for antineoplastic radiation therapy: Secondary | ICD-10-CM | POA: Diagnosis not present

## 2014-06-25 DIAGNOSIS — C7931 Secondary malignant neoplasm of brain: Secondary | ICD-10-CM

## 2014-06-25 DIAGNOSIS — C679 Malignant neoplasm of bladder, unspecified: Secondary | ICD-10-CM

## 2014-06-25 MED ORDER — OXYCODONE HCL 5 MG PO TABS: 5.0000 mg | ORAL_TABLET | ORAL | Status: DC | PRN

## 2014-06-25 NOTE — Progress Notes (Addendum)
  Radiation Oncology         [id number]) [phone] ________________________________  Name: Erik Gross MRN: [id number]  Date: 06/25/2014  DOB: 1955-07-11  Weekly Radiation Therapy Management    ICD-9-CM ICD-10-CM   1. Brain metastases 198.3 C79.31 oxyCODONE (OXY IR/ROXICODONE) 5 MG immediate release tablet  2. Transitional cell carcinoma of bladder 188.9 C67.9     Current Dose: 25.2 Gy     Planned Dose:  45 Gy  Narrative . . . . . . . . The patient presents for routine under treatment assessment.                                   Weekly rad tx bladder 14 txs completed, patient top of head and left side temple erythema dry desquamation, using baifineHe hasnot had a bowelmovement in 5 days. He cannot take miralax same time as gabapentin. His wife stated that he "Has to wait 2 hours for better absorption". He took docusate2 tabs yesterday; nothing today. He reports drinking plenty of water, nausea, poor appetite, having only 1/2 sandwich today, fatigue, nocturia 4-4x, and hematuria on tissue after voiding. Reports b/l side and back pain. Hand tingling has improved with gabapentin.                                 Set-up films were reviewed.                                 The chart was checked. Physical Findings. . .  height is 5\' 6"  (1.676 m) and weight is 197 lb 8 oz (89.585 kg). His oral temperature is 98.3 F (36.8 C). His blood pressure is 134/68 and his pulse is 107. His respiration is 20 and oxygen saturation is 98%. . Weight essentially stable.  No significant changes. Impression . . . . . . . The patient is tolerating radiation. Plan . . . . . . . . . . . . Continue treatment as planned. Fleets enema today and Miralax daily with pain meds. Recommended taking prochlorperazine in evening and ondansetron in morning.  Re-staging next week to determine radiation dose to bladder.  If widespread metastases, will stop early to proceed with chemo.  If no significant disease beyond brain and bladder,  would suggest more definitive dose for bladder.   This document serves as a record of services personally performed by Tyler Pita, MD. It was created on his behalf by Pearlie Oyster, a trained medical scribe. The creation of this record is based on the scribe's personal observations and the provider's statements to them. This document has been checked and approved by the attending provider.      ________________________________  Sheral Apley. Tammi Klippel, M.D.

## 2014-06-25 NOTE — Progress Notes (Addendum)
Weekly rad tx bladder 14 txs completed, patient top of head and left side temple erythema dry desquamation, using baifine  At times, has  Not had a bowel  Movement in 5 days, cannot take miralax same time as gabapentin, stated "Have to wait 2 hours for better absorption" per wife, took docusate  2 tabs yesterday, nothing today , drinks plenty waters , nausea and poor appetite, only has had 1/2 sandwich today, fatigued, nocturia 4-4x, hematuria on tissue after voiding, stated, BP 134/68 mmHg  Pulse 107  Temp(Src) 98.3 F (36.8 C) (Oral)  Resp 20  Ht 5\' 6"  (1.676 m)  Wt 197 lb 8 oz (89.585 kg)  BMI 31.89 kg/m2  SpO2 98% 2:54 PM

## 2014-06-28 ENCOUNTER — Ambulatory Visit (HOSPITAL_COMMUNITY)
Admission: RE | Admit: 2014-06-28 | Discharge: 2014-06-28 | Disposition: A | Discharge status: Home / Self Care | Payer: BLUE CROSS/BLUE SHIELD | Source: Ambulatory Visit | Attending: Radiation Oncology | Admitting: Radiation Oncology

## 2014-06-28 ENCOUNTER — Encounter (HOSPITAL_COMMUNITY): Payer: Self-pay

## 2014-06-28 ENCOUNTER — Ambulatory Visit
Admission: RE | Admit: 2014-06-28 | Discharge: 2014-06-28 | Disposition: A | Discharge status: Home / Self Care | Payer: BLUE CROSS/BLUE SHIELD | Source: Ambulatory Visit | Attending: Radiation Oncology | Admitting: Radiation Oncology

## 2014-06-28 ENCOUNTER — Other Ambulatory Visit: Payer: Self-pay | Admitting: Radiation Oncology

## 2014-06-28 DIAGNOSIS — Z510 Encounter for antineoplastic radiation therapy: Secondary | ICD-10-CM | POA: Diagnosis not present

## 2014-06-28 DIAGNOSIS — C672 Malignant neoplasm of lateral wall of bladder: Secondary | ICD-10-CM | POA: Insufficient documentation

## 2014-06-28 DIAGNOSIS — R110 Nausea: Secondary | ICD-10-CM | POA: Diagnosis not present

## 2014-06-28 DIAGNOSIS — R630 Anorexia: Secondary | ICD-10-CM | POA: Diagnosis not present

## 2014-06-28 DIAGNOSIS — C7931 Secondary malignant neoplasm of brain: Secondary | ICD-10-CM | POA: Diagnosis not present

## 2014-06-28 DIAGNOSIS — R5383 Other fatigue: Secondary | ICD-10-CM | POA: Diagnosis not present

## 2014-06-28 DIAGNOSIS — C801 Malignant (primary) neoplasm, unspecified: Secondary | ICD-10-CM

## 2014-06-28 LAB — BUN AND CREATININE (CC13)
BUN: 8.2 mg/dL (ref 7.0–26.0)
Creatinine: 0.9 mg/dL (ref 0.7–1.3)
EGFR: >90 mL/min/{1.73_m2} (ref >90)

## 2014-06-28 MED ORDER — IOHEXOL 300 MG/ML  SOLN
100.0000 mL | Freq: Once | INTRAMUSCULAR | Status: AC | PRN
  Administered 2014-06-28: 100 mL via INTRAVENOUS

## 2014-06-29 ENCOUNTER — Ambulatory Visit
Admission: RE | Admit: 2014-06-29 | Discharge: 2014-06-29 | Disposition: A | Discharge status: Home / Self Care | Payer: BLUE CROSS/BLUE SHIELD | Source: Ambulatory Visit | Attending: Radiation Oncology | Admitting: Radiation Oncology

## 2014-06-29 DIAGNOSIS — Z510 Encounter for antineoplastic radiation therapy: Secondary | ICD-10-CM | POA: Diagnosis not present

## 2014-06-30 ENCOUNTER — Ambulatory Visit
Admission: RE | Admit: 2014-06-30 | Discharge: 2014-06-30 | Disposition: A | Discharge status: Home / Self Care | Payer: BLUE CROSS/BLUE SHIELD | Source: Ambulatory Visit | Attending: Radiation Oncology | Admitting: Radiation Oncology

## 2014-06-30 DIAGNOSIS — Z510 Encounter for antineoplastic radiation therapy: Secondary | ICD-10-CM | POA: Diagnosis not present

## 2014-07-01 ENCOUNTER — Ambulatory Visit: Payer: BLUE CROSS/BLUE SHIELD

## 2014-07-01 ENCOUNTER — Ambulatory Visit
Admission: RE | Admit: 2014-07-01 | Discharge: 2014-07-01 | Disposition: A | Discharge status: Home / Self Care | Payer: BLUE CROSS/BLUE SHIELD | Source: Ambulatory Visit | Attending: Radiation Oncology | Admitting: Radiation Oncology

## 2014-07-01 DIAGNOSIS — Z510 Encounter for antineoplastic radiation therapy: Secondary | ICD-10-CM | POA: Diagnosis not present

## 2014-07-02 ENCOUNTER — Encounter: Payer: Self-pay | Admitting: Radiation Oncology

## 2014-07-02 ENCOUNTER — Ambulatory Visit
Admission: RE | Admit: 2014-07-02 | Discharge: 2014-07-02 | Disposition: A | Discharge status: Home / Self Care | Payer: BLUE CROSS/BLUE SHIELD | Source: Ambulatory Visit | Attending: Radiation Oncology | Admitting: Radiation Oncology

## 2014-07-02 DIAGNOSIS — Z510 Encounter for antineoplastic radiation therapy: Secondary | ICD-10-CM | POA: Insufficient documentation

## 2014-07-02 DIAGNOSIS — C7931 Secondary malignant neoplasm of brain: Secondary | ICD-10-CM | POA: Insufficient documentation

## 2014-07-02 DIAGNOSIS — C672 Malignant neoplasm of lateral wall of bladder: Secondary | ICD-10-CM | POA: Diagnosis not present

## 2014-07-02 MED ORDER — BIAFINE EX EMUL
Freq: Every day | CUTANEOUS | Status: DC
  Administered 2014-07-02: 10:00:00 via TOPICAL

## 2014-07-05 ENCOUNTER — Ambulatory Visit
Admission: RE | Admit: 2014-07-05 | Discharge: 2014-07-05 | Disposition: A | Discharge status: Home / Self Care | Payer: BLUE CROSS/BLUE SHIELD | Source: Ambulatory Visit | Attending: Radiation Oncology | Admitting: Radiation Oncology

## 2014-07-05 ENCOUNTER — Ambulatory Visit: Payer: BLUE CROSS/BLUE SHIELD

## 2014-07-06 ENCOUNTER — Telehealth: Payer: Self-pay | Admitting: Radiation Oncology

## 2014-07-06 ENCOUNTER — Ambulatory Visit
Admission: RE | Admit: 2014-07-06 | Discharge: 2014-07-06 | Disposition: A | Discharge status: Home / Self Care | Payer: BLUE CROSS/BLUE SHIELD | Source: Ambulatory Visit | Attending: Radiation Oncology | Admitting: Radiation Oncology

## 2014-07-06 ENCOUNTER — Ambulatory Visit: Payer: BLUE CROSS/BLUE SHIELD

## 2014-07-06 NOTE — Telephone Encounter (Signed)
Received message from [facility] on L3 that the patient no longer wants to continue radiation treatment. Phoned back and spoke with Candace on L3 who confirmed the patient cancelled treatment Monday and today. Phoned patient's wife, Venida Jarvis. She reports that her husband made this decision following his PUT with Dr. Tammi Klippel on Friday. Sherri reports she understood from Dr. Tammi Klippel that a follow up with Alen Blew was in order to determine what the future looked like for [place] with or without chemotherapy. Sherri reports that [place] only heard the word Hospice. She states, "Ramiro doesn't want to spend his final days going to the hospital daily." Mrs. Raine requested Dr. Tammi Klippel call her daughter, Luetta Nutting, at [phone] to discuss this matter further than, call her. She explains that [employer] understands better than she does and "deals with bad new instead of shutting down."

## 2014-07-07 ENCOUNTER — Ambulatory Visit: Payer: BLUE CROSS/BLUE SHIELD

## 2014-07-08 ENCOUNTER — Ambulatory Visit: Payer: BLUE CROSS/BLUE SHIELD

## 2014-07-09 ENCOUNTER — Ambulatory Visit: Payer: BLUE CROSS/BLUE SHIELD

## 2014-07-09 NOTE — Progress Notes (Signed)
  Radiation Oncology         [id number]) [phone] ________________________________  Name: [name] MRN: [id number]  Date: 07/02/2014  DOB: 1955-12-07  End of Treatment Note  Diagnosis:   59 yo man with brain metastases and hematuria from metastatic bladder cancer  Indication for treatment:  Palliation       Radiation treatment dates:   06/07/2014-07/02/2014   Site/dose:    1.  The whole brain was treated to 30 Gy in 12 fractions of 2.5 Gy 2.  The bladder was treated to 34.2 Gy in 19 fractions of 1.8 Gy  Beams/energy:    1.  Right and Left radiation fields were treated using 6 MV X-rays with custom MLC collimation to shield the eyes and face.  The patient was immobilized with a thermoplastic mask and isocenter was verified with weekly port films. 2.  The bladder was treated with a standard 4-beam arrangement from AP, PA, Rt, and Lt with custom blocking to cover the bladder only.  Narrative: The patient tolerated radiation treatment relatively well.   His hematuria improved.  Unfortunately, re-staging CTs showed new lung nodules and liver metastases, prompting the patient to discontinue treatment.  Plan: The patient has completed radiation treatment. The patient will return to radiation oncology clinic for routine followup in one month. I advised them to call or return sooner if they have any questions or concerns related to their recovery or treatment.  He will follow-up with med-onc regarding progressive systemic disease and understands that additional bladder radiotherapy can be considered if hematuria returns.  ________________________________  Sheral Apley Tammi Klippel, M.D.

## 2014-07-12 ENCOUNTER — Ambulatory Visit: Payer: BLUE CROSS/BLUE SHIELD

## 2014-07-13 ENCOUNTER — Ambulatory Visit: Payer: BLUE CROSS/BLUE SHIELD

## 2014-07-13 ENCOUNTER — Telehealth: Payer: Self-pay | Admitting: Radiation Oncology

## 2014-07-13 NOTE — Telephone Encounter (Signed)
Returned voicemail message left by patient's wife, Venida Jarvis. She is concerned the patient isn't eating much because of nausea. Encouraged her to have her husband take Zofran every eight hours around the clock and compazine every six hours around the clock. Also, encouraged that the antiemetic be scheduled thirty minutes prior to other medications. Advised medications be taken with a small amount of food. Advised that zofran can cause constipation. Instructed Sherri to call this RN back in 48 hours if this advise doesn't help because it maybe that the patient needs decadron. She verbalized understanding.

## 2014-07-14 ENCOUNTER — Ambulatory Visit: Payer: BLUE CROSS/BLUE SHIELD

## 2014-07-15 ENCOUNTER — Ambulatory Visit: Payer: BLUE CROSS/BLUE SHIELD

## 2014-07-16 ENCOUNTER — Ambulatory Visit: Payer: BLUE CROSS/BLUE SHIELD

## 2014-07-16 ENCOUNTER — Telehealth: Payer: Self-pay | Admitting: Radiation Oncology

## 2014-07-16 NOTE — Progress Notes (Signed)
  Radiation Oncology         [phone]) [phone] ________________________________  Name: Erik Gross MRN: [id number]  Date: 06/03/2014  DOB: [birth year]  SIMULATION AND TREATMENT PLANNING NOTE  No diagnosis found.  DIAGNOSIS:  59 yo man with brain mets and hematuria from bladder cancer  NARRATIVE:  The patient was brought to the [facility].  Identity was confirmed.  All relevant records and images related to the planned course of therapy were reviewed.  The patient freely provided informed written consent to proceed with treatment after reviewing the details related to the planned course of therapy. The consent form was witnessed and verified by the simulation staff.  Then, the patient was set-up in a stable reproducible  supine position for radiation therapy.  CT images were obtained.  Surface markings were placed.  The CT images were loaded into the planning software.  Then the target and avoidance structures were contoured.  Treatment planning then occurred.  The radiation prescription was entered and confirmed.  Then, I designed and supervised the construction of a total of 3 medically necessary complex treatment devices, including a custom made thermoplastic mask used for immobilization and two complex multileaf collimators to cover the entire intracranial contents, while shielding the eyes and face.  Each [facility] is independently created to account for beam divergence.  The right and left lateral fields will be treated with 6 MV X-rays.  I have requested : Isodose Plan.    Then, the patient was set-up in a stable reproducible  Supine bladder treatment position for radiation therapy.  CT images were obtained.  Surface markings were placed.  The CT images were loaded into the planning software.  Then the target and avoidance structures were contoured.  Treatment planning then occurred.  The radiation prescription was entered and confirmed.  Then, I designed and supervised the construction of  a total of 4 medically necessary complex treatment devices consisting of 3 MLC devices.  I have requested : Isodose Plan.  I have ordered:CBC  PLAN:  The patient will receive 30 Gy in 12 fractions to the brain and higher to the bladder depending on re-staging.  ________________________________  Sheral Apley Tammi Klippel, M.D.

## 2014-07-16 NOTE — Telephone Encounter (Signed)
Patient's wife, [name], phoned today concerned. She reports the regimen of taking zofran and compazine around the clock has really helped to decrease her husband's nausea. However, she remains concerned about his poor oral intake and decreased appetite. Reports at his physical today he weighed 179.5 lb far from the 198 lb she is accustom to him weighing. Explained that this RN will inform Dr. Tammi Klippel of these finding and phone her back with further directions. She verbalized understanding and expressed appreciation for the return call.

## 2014-07-16 NOTE — Telephone Encounter (Signed)
Could consider dex 2 mg bid

## 2014-07-19 ENCOUNTER — Ambulatory Visit: Payer: BLUE CROSS/BLUE SHIELD

## 2014-07-20 ENCOUNTER — Ambulatory Visit: Payer: BLUE CROSS/BLUE SHIELD

## 2014-07-20 ENCOUNTER — Telehealth: Payer: Self-pay | Admitting: Radiation Oncology

## 2014-07-20 NOTE — Telephone Encounter (Signed)
Phoned patient's wife, [name], to inform her of Dr. Johny Shears recommendation for decadron 2 mg bid to manage nausea, lack of energy and decreased appetite. She reports that since we last spoke her husband is experiencing more flank pain, becomes SOB more easily and is unsteady. She reports she reached out to their PCP about increasing his oxycodone. She questions if decadron will "do more damage than good and quicken his demise by damaging his heart." Listened to and normalized Sherry's feelings. Helped her to weigh the pros and cons associated with decadron. Encouraged Hospice. Ultimately, [name] decided to discuss decadron with her PCP vs. Increasing oxycodone. She will call this RN back if decadron needs to be called into their pharmacy. She expressed appreciation for the return call and support.

## 2014-07-20 NOTE — Telephone Encounter (Signed)
Received returned call from patient's wife. She has spoken with the patient and his PCP. She now wishes for the decadron Dr. Tammi Klippel encouraged [facility] [place]. [facility] [place] Per Dr. Johny Shears order called in decadron 2 mg bid, qty 2, and no refills. Phoned [name] back explaining this was done. Reminded her of potential side effects associated with decadron. Advised again stopping this medication without the direction of a physician. She verbalized understanding of all reviewed and expressed appreciation for the call.

## 2014-07-21 ENCOUNTER — Ambulatory Visit: Payer: BLUE CROSS/BLUE SHIELD

## 2014-07-22 ENCOUNTER — Ambulatory Visit: Payer: BLUE CROSS/BLUE SHIELD

## 2014-07-23 ENCOUNTER — Ambulatory Visit: Payer: BLUE CROSS/BLUE SHIELD

## 2014-07-26 ENCOUNTER — Ambulatory Visit: Payer: BLUE CROSS/BLUE SHIELD

## 2014-07-26 ENCOUNTER — Telehealth: Payer: Self-pay | Admitting: Radiation Oncology

## 2014-07-26 ENCOUNTER — Telehealth: Payer: Self-pay | Admitting: *Deleted

## 2014-07-26 NOTE — Telephone Encounter (Signed)
TC from julia @ [facility] Family has requested Hospice services at home. They are asking if Dr. Alen Blew will be the attending physician. Also hospice will need demographics and last office visit. They have fax'd form to be filled out for certification of terminal illness for Dr. Alen Blew to sign.

## 2014-07-26 NOTE — Telephone Encounter (Signed)
Returned message left by Olevia Perches of [facility]. Also, spoke with patient's wife, [name]. Offered comfort. Encouraged her to call with future needs.

## 2014-07-27 ENCOUNTER — Other Ambulatory Visit: Payer: BLUE CROSS/BLUE SHIELD

## 2014-07-27 ENCOUNTER — Ambulatory Visit: Payer: BLUE CROSS/BLUE SHIELD

## 2014-07-27 ENCOUNTER — Telehealth: Payer: Self-pay | Admitting: *Deleted

## 2014-07-27 NOTE — Telephone Encounter (Signed)
Spoke with julia at [facility]. Per dr Alen Blew, he will be the attending, hospice physicians to do symptom mamagement and have DNR signed per family 's request.  faxed signed referral form with demographics and last o.v. Note. [phone]

## 2014-07-28 ENCOUNTER — Ambulatory Visit: Payer: BLUE CROSS/BLUE SHIELD

## 2014-07-28 ENCOUNTER — Telehealth: Payer: Self-pay | Admitting: *Deleted

## 2014-07-28 NOTE — Telephone Encounter (Signed)
TC from pt's wife requesting to cancel appt scheduled with Dr. Alen Blew on 07/29/14. Her husband now has Hospice care in the home.

## 2014-07-29 ENCOUNTER — Ambulatory Visit: Payer: BLUE CROSS/BLUE SHIELD | Admitting: Oncology

## 2014-08-02 ENCOUNTER — Telehealth: Payer: Self-pay | Admitting: Radiation Oncology

## 2014-08-02 ENCOUNTER — Other Ambulatory Visit: Payer: Self-pay | Admitting: *Deleted

## 2014-08-02 ENCOUNTER — Encounter: Payer: Self-pay | Admitting: Radiation Oncology

## 2014-08-02 DIAGNOSIS — C679 Malignant neoplasm of bladder, unspecified: Secondary | ICD-10-CM

## 2014-08-02 MED ORDER — ONDANSETRON HCL 8 MG PO TABS: 8.0000 mg | ORAL_TABLET | Freq: Three times a day (TID) | ORAL | Status: AC | PRN

## 2014-08-02 MED ORDER — PROCHLORPERAZINE MALEATE 10 MG PO TABS: 10.0000 mg | ORAL_TABLET | Freq: Four times a day (QID) | ORAL | Status: AC | PRN

## 2014-08-02 MED ORDER — DEXAMETHASONE 4 MG PO TABS: 2.0000 mg | ORAL_TABLET | Freq: Two times a day (BID) | ORAL | Status: AC

## 2014-08-02 MED ORDER — ONDANSETRON HCL 8 MG PO TABS: 8.0000 mg | ORAL_TABLET | Freq: Three times a day (TID) | ORAL | Status: DC | PRN

## 2014-08-02 NOTE — Telephone Encounter (Signed)
Refill scripts for zofran, decadron, and compazine called to patient's pharmacy. Family notified.

## 2014-08-02 NOTE — Telephone Encounter (Signed)
Received [place] paperwork via mail from patient's wife, [name]. Completed paperwork. Took completed paperwork to [facility] to log. Levada Dy will place orange folder in Dr. Johny Shears inbox with paperwork to sign. Then, she will mail/fax paperwork accordingly.

## 2014-08-02 NOTE — Progress Notes (Signed)
Rec'd completed paperwork from RN for Erik Gross.  Forward to physician for signature.

## 2014-08-02 NOTE — Telephone Encounter (Signed)
TC from pt's wife. She states Erik Gross needs refills on Prochlorperazine, Ondansetron and Dexamethasone 2mg  BID (not on med list).  Apparently Dr. Tammi Klippel has filled in the past but patient is not seeing him at this time.  Also pt. Having frequent hiccoughs and needs something for that. His wife believes he took Lorazepam 1 mg every 6 hours prn for that about 1 year ago. He does not have any at this time.  Hospice to start with him sometime this week. Discussed pain management and hospice with wife. She had been reluctant to start with Hospice as she did not know that they did pain management. She was relieved to hear that pain/symptom management is a huge part of what Hospice does.

## 2014-08-06 ENCOUNTER — Other Ambulatory Visit: Payer: Self-pay | Admitting: *Deleted

## 2014-08-06 ENCOUNTER — Telehealth: Payer: Self-pay | Admitting: *Deleted

## 2014-08-06 DIAGNOSIS — C7931 Secondary malignant neoplasm of brain: Secondary | ICD-10-CM

## 2014-08-06 DIAGNOSIS — C672 Malignant neoplasm of lateral wall of bladder: Secondary | ICD-10-CM

## 2014-08-06 DIAGNOSIS — C679 Malignant neoplasm of bladder, unspecified: Secondary | ICD-10-CM

## 2014-08-06 MED ORDER — MORPHINE SULFATE ER 30 MG PO TBCR: 30.0000 mg | EXTENDED_RELEASE_TABLET | Freq: Two times a day (BID) | ORAL | Status: DC

## 2014-08-06 MED ORDER — OXYCODONE HCL 5 MG PO TABS: 5.0000 mg | ORAL_TABLET | ORAL | Status: DC | PRN

## 2014-08-06 MED ORDER — LORAZEPAM 0.5 MG PO TABS: 0.5000 mg | ORAL_TABLET | ORAL | Status: AC | PRN

## 2014-08-06 NOTE — Telephone Encounter (Signed)
TC from [place], [place] with [facility]. She needs some orders:  1. Pt. Has portacath  And needs flush orders fax'd to [phone] (attention triage)  2. Pt having increased pain  And using pain meds consistently around the clock every 3 hours-alternates oxycodone/apap 10 mg/325 1 every 6 hours, alternating with palin oxycodone 1 every 6 hrs.  RN feels he needs long acting pain med and then to use oxycodone 10 mg for breakthrough pain every 4 hrs.  He needs oxycodone filled before Monday as he is just about out. RN requesting  both new scripts to [facility] [place].   3. Pt. Having increased anxiety/tearfulness. RN  Requesting Lorazepam 0.5 mg 1-2 tabs every 4 hrs as needed for anxiety. Pt has taken in the past.  Please write HOSPICE PATIENT on scripts.  Please call Helene Kelp, RN back at [phone]

## 2014-08-06 NOTE — Telephone Encounter (Signed)
This RN spoke with Erik Gross, Hospice nurse, regarding pain medications. Per Dr. Alen Blew, he can have MS Contin, Oxycodone and Ativan. [facility] [place]. Reviewed medications with Erik Gross. Erik Gross will right orders for port-a-cath flushes. Told Erik Gross that if she needed anything else please call the office. Erik Gross verbalized understanding.

## 2014-08-10 ENCOUNTER — Other Ambulatory Visit: Payer: Self-pay | Admitting: *Deleted

## 2014-08-10 ENCOUNTER — Encounter: Payer: Self-pay | Admitting: *Deleted

## 2014-08-10 MED ORDER — OXYCODONE HCL 5 MG PO TABS: ORAL_TABLET | ORAL | Status: DC

## 2014-08-10 NOTE — Progress Notes (Signed)
Script for oxycodone 5 mg faxed to [facility] with hospice patient written on front. Hospice nurse Rosanne Ashing and wife sherry aware of directions.

## 2014-08-12 ENCOUNTER — Encounter: Payer: Self-pay | Admitting: Radiation Oncology

## 2014-08-12 ENCOUNTER — Telehealth: Payer: Self-pay | Admitting: Radiation Oncology

## 2014-08-12 ENCOUNTER — Other Ambulatory Visit: Payer: Self-pay | Admitting: *Deleted

## 2014-08-12 MED ORDER — OXYCODONE HCL 5 MG PO TABS: ORAL_TABLET | ORAL | Status: DC

## 2014-08-12 NOTE — Telephone Encounter (Signed)
Returned message left by patient's wife inquiring about disability paper. Phoned wife. Explained per Ailene Ravel Hobgood this paperwork was faxed. Per Christie Beckers request original completed and signed disability paper work will be mailed to her home by [employer]. Additionally, [name] expressed that both her and Annie are pleased with Hospice care. She expressed appreciation for the return call and understands to call this RN with future needs.

## 2014-08-12 NOTE — Progress Notes (Signed)
Faxed completed attending physician's statement to the [facility], originals scanned

## 2014-08-12 NOTE — Progress Notes (Signed)
Mailed [place] paperwork to [employer]

## 2014-08-12 NOTE — Telephone Encounter (Signed)
Spoke with Erik Gross at [facility],patient has 2+ edema in feet. Requesting prn lasix order. Wants to continue with decadron 2 mg bid. Also needs another script for oxycodone 5 mg tabs. Requesting # 360 and pharmacy will only fill 15 days worth, per hospice protocol   Will dispense remainder after 15 days. Per dr Alen Blew, ok for lasix order 20-40 mg prn. Continue with decadron as ordered. Will fax new script to [facility] for oxycodone # 360. Nurse Rosanne Ashing notified. She will call in lasix to patient's pharmacy.

## 2014-08-17 ENCOUNTER — Encounter: Payer: Self-pay | Admitting: *Deleted

## 2014-08-17 NOTE — Progress Notes (Signed)
This RN faxed a refill request for Oxygen to [facility], [place], [place]. Fax # is [phone].

## 2014-08-23 ENCOUNTER — Other Ambulatory Visit: Payer: Self-pay | Admitting: *Deleted

## 2014-08-23 ENCOUNTER — Telehealth: Payer: Self-pay | Admitting: *Deleted

## 2014-08-23 DIAGNOSIS — C7931 Secondary malignant neoplasm of brain: Secondary | ICD-10-CM

## 2014-08-23 DIAGNOSIS — C672 Malignant neoplasm of lateral wall of bladder: Secondary | ICD-10-CM

## 2014-08-23 MED ORDER — MORPHINE SULFATE (CONCENTRATE) 20 MG/ML PO SOLN: ORAL | Status: DC

## 2014-08-23 MED ORDER — FENTANYL 25 MCG/HR TD PT72: 25.0000 ug | MEDICATED_PATCH | TRANSDERMAL | Status: AC

## 2014-08-23 MED ORDER — MORPHINE SULFATE (CONCENTRATE) 20 MG/ML PO SOLN: ORAL | Status: AC

## 2014-08-23 NOTE — Telephone Encounter (Signed)
Pls proceed 

## 2014-08-23 NOTE — Telephone Encounter (Signed)
[facility] for Roxanol [facility] [place] and [place], RN w/ [facility] was notified.   Vivien Rota calls back now to state pt unable to swallow Ms contin and asks for Fentanyl patch for long acting pain medication.   [facility] [place].   Notified Toni w/ Hospice.

## 2014-08-23 NOTE — Telephone Encounter (Signed)
[facility]Marland Kitchen  LVM for Vivien Rota, [facility] RN,  Rx faxed.

## 2014-08-23 NOTE — Telephone Encounter (Signed)
[place] RN with [facility] called reporting patient has bladder cancer with brain mets. Could Dr. Alen Blew give an order for Morphine concentrate/ Roxanol 20 mg/ml for 0.5 to 2 ml every 1 to 2 hours prn pain.  [facility]ed [facility] [address] In [place], [place].  Current MS Contrin 30 mg q12 hrs and oxy IR 5 mg every four hours not touching his pain.  Last night the Comfort Kit kit was started with morphine 0.25 mg every two hours.  At 11:00 increased to 0.5 mg every two hours.  He is not short of breath but restless.  Vivien Rota is on her way to patient's home about 1:15 pm.  Aware Dr. Alen Blew out of office and due to return 08/31/2014 and would like a provider to help with orders for this Hospice patient.

## 2014-08-25 ENCOUNTER — Telehealth: Payer: Self-pay | Admitting: *Deleted

## 2014-08-25 NOTE — Telephone Encounter (Signed)
Helene Kelp RN, called to report she pronounced patient today at 10:55 am.  Will notify Dr. Alen Blew.

## 2014-08-26 ENCOUNTER — Telehealth: Payer: Self-pay | Admitting: Oncology

## 2014-08-26 NOTE — Telephone Encounter (Signed)
Received death certificate [birth year]

## 2014-08-31 ENCOUNTER — Telehealth: Payer: Self-pay | Admitting: Oncology

## 2014-08-31 NOTE — Telephone Encounter (Signed)
Mailed pt death certificate to [employer] 08/31/14

## 2014-09-17 DEATH — deceased

## 2015-09-02 ENCOUNTER — Other Ambulatory Visit: Payer: Self-pay | Admitting: Nurse Practitioner

## 2016-04-12 IMAGING — MR MR HEAD WO/W CM
5 of 6 series · 26 of 48 positions shown · IV contrast (multihance)
Comparison: Brain MRI 04/14/2014.

CLINICAL DATA: 58-year-old male with severe headaches in confusion.
Metastatic disease to the brain, current history of bladder cancer.
Stereotactic therapy planning protocol. Subsequent encounter.

EXAM:
MRI HEAD WITHOUT AND WITH CONTRAST
TECHNIQUE: Multiplanar, multiecho pulse sequences of the brain and surrounding
structures were obtained without and with intravenous contrast.
CONTRAST:  17mL MULTIHANCE GADOBENATE DIMEGLUMINE 529 MG/ML IV SOLN

[Series 4: FLAIR · axial · 3.0mm · 0.51mm/px · z∈[-67,+122]mm · 6 of 64 slices shown]
[im 1/64]
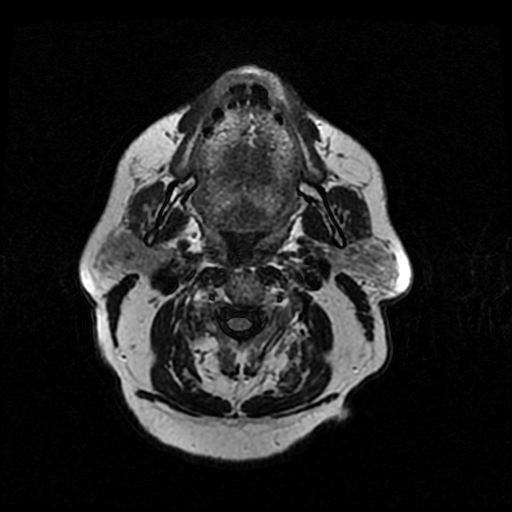
[im 13/64]
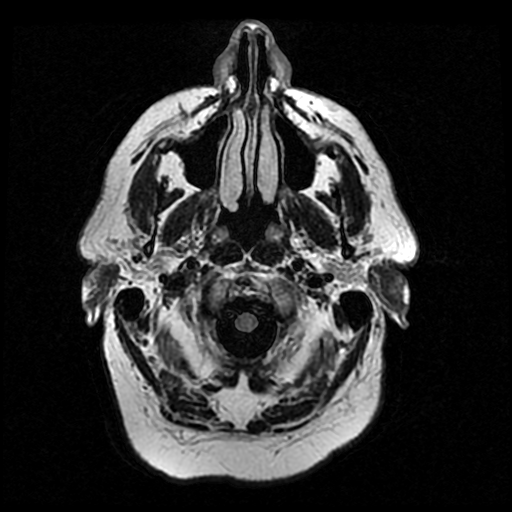
[im 26/64]
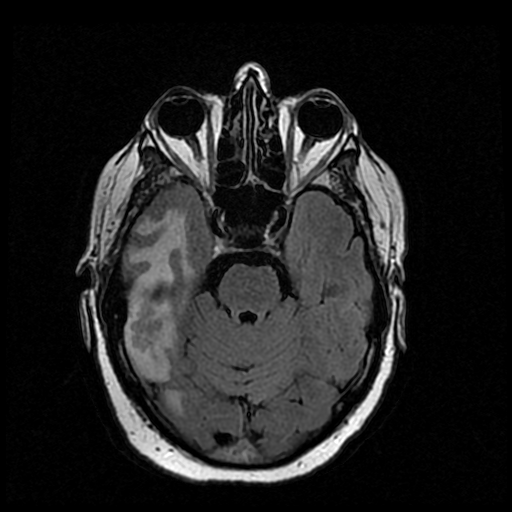
[im 38/64]
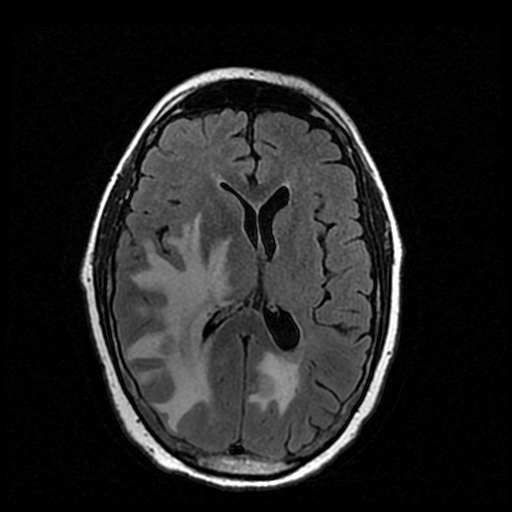
[im 51/64]
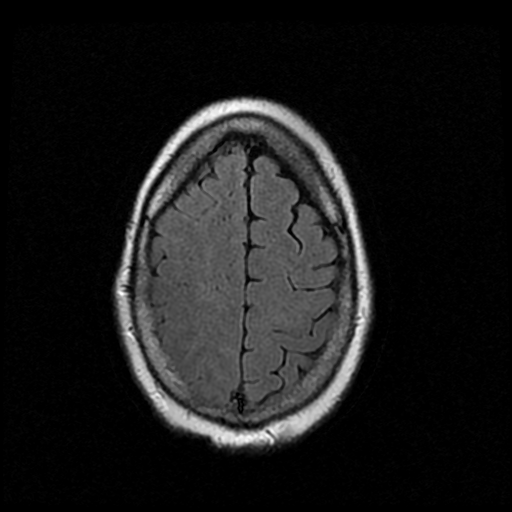
[im 64/64]
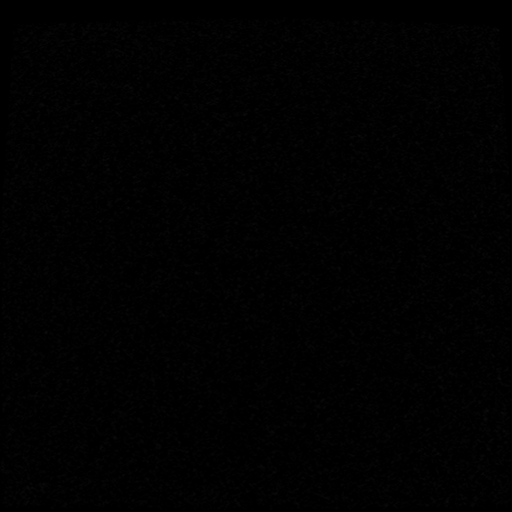

[Series 5: T1 · sagittal · 5.0mm · 0.51mm/px · 3 of 32 slices shown]
[im 1/32]
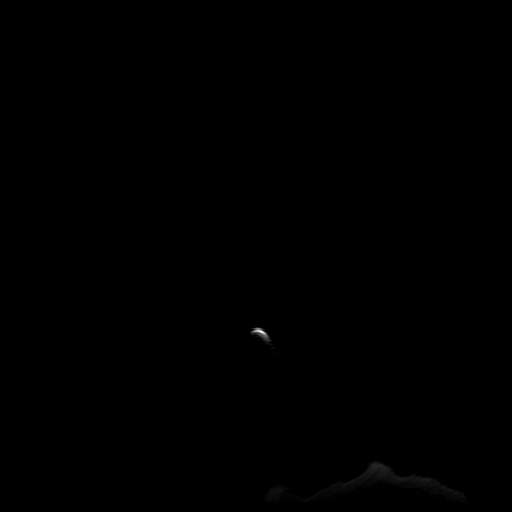
[im 16/32]
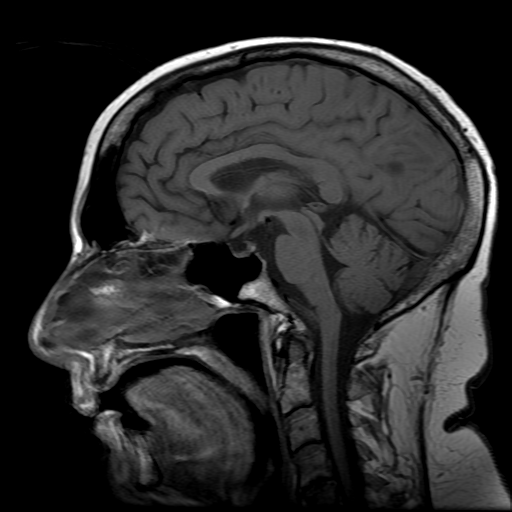
[im 32/32]
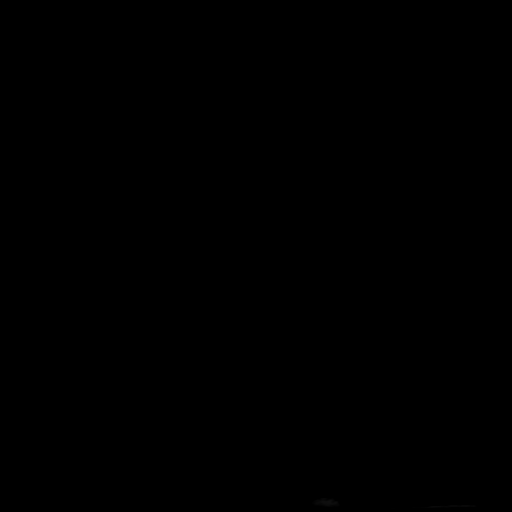

[Series 6: ax fspgr irp · axial · 1.0mm · 1.02mm/px · z∈[-52,+123]mm · 10 of 176 slices shown]
[im 1/176]
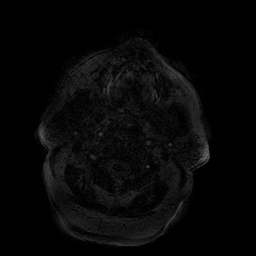
[im 12/176]
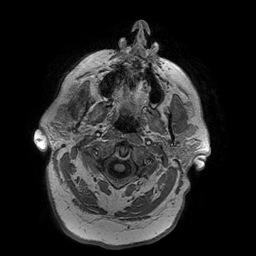
[im 24/176]
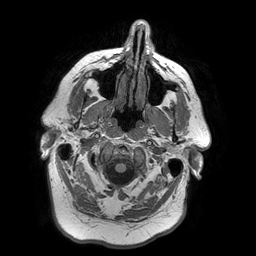
[im 36/176]
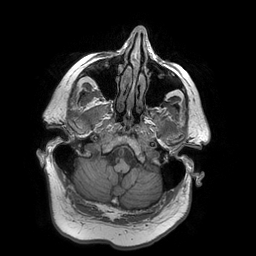
[im 59/176]
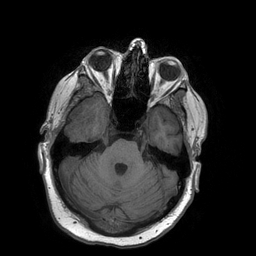
[im 82/176]
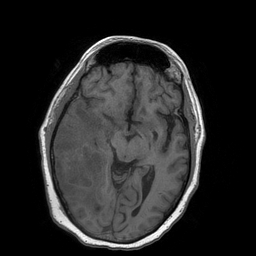
[im 106/176]
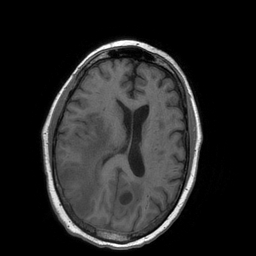
[im 129/176]
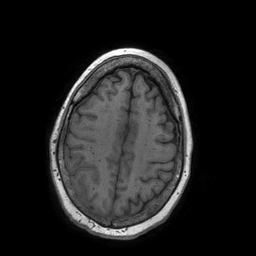
[im 152/176]
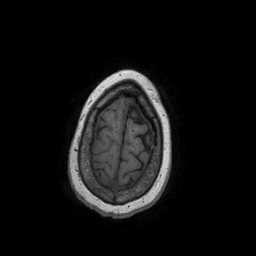
[im 176/176]
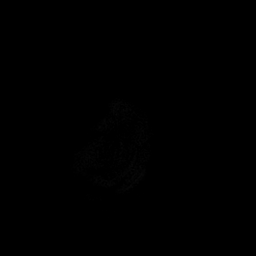

[Series 8: T1 post-contrast · coronal · 5.0mm · 0.43mm/px · 4 of 41 slices shown (1 of 2)]
[im 1/41]
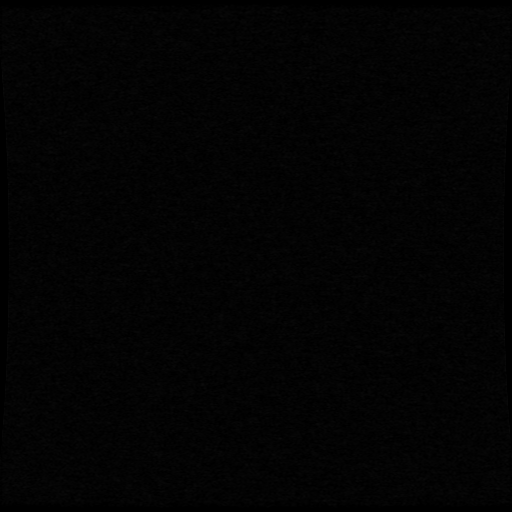
[im 14/41]
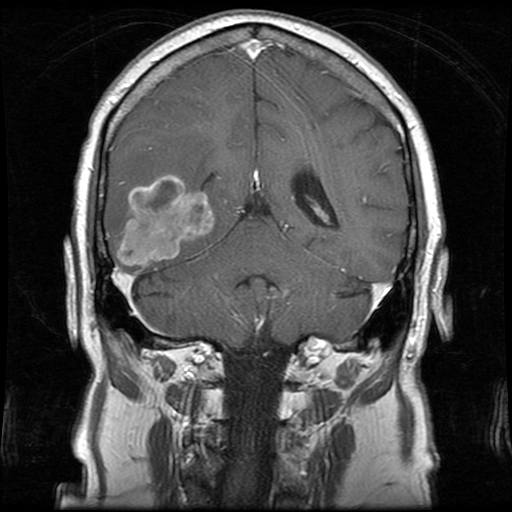
[im 27/41]
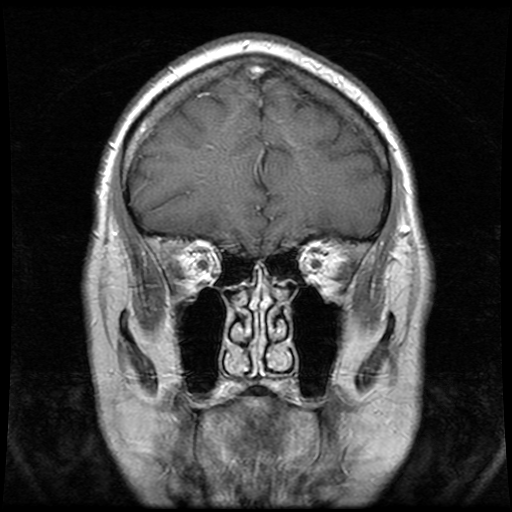
[im 41/41]
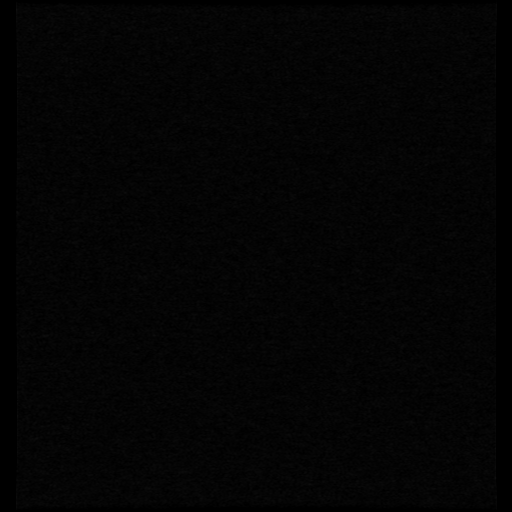

[Series 9: T1 post-contrast · sagittal · 5.0mm · 0.51mm/px · 3 of 32 slices shown (2 of 2)]
[im 1/32]
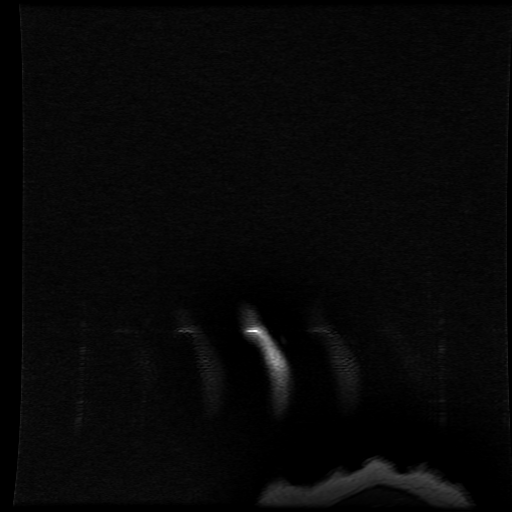
[im 16/32]
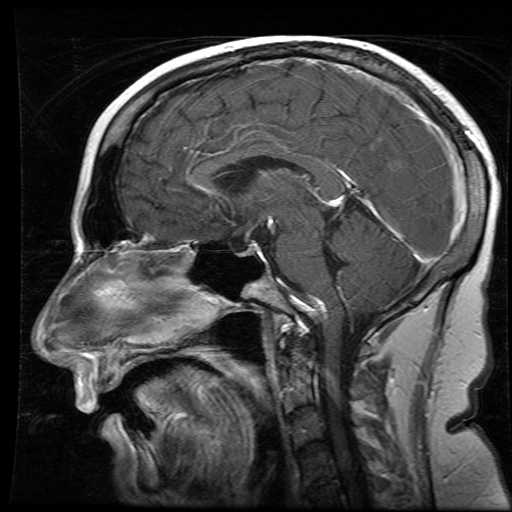
[im 32/32]
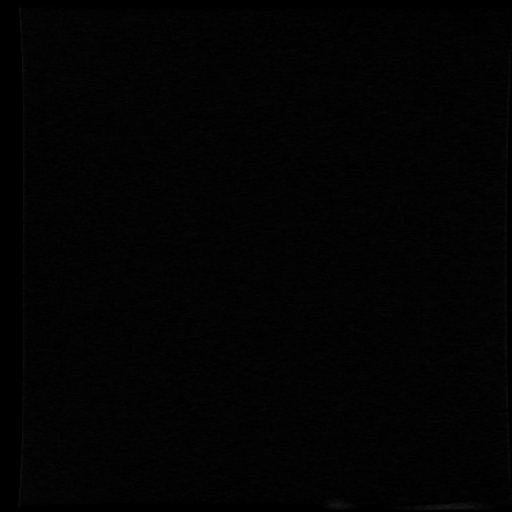

[26 of 48 positions shown; findings below may reference images not displayed]

FINDINGS: Bilateral cerebral edema, moderate severe on the right, has not
significantly changed. Associated intracranial mass effect with
Stable leftward midline shift (10 mm). Stable partial effacement of
the right lateral ventricle. Basilar cisterns remain patent.

Nine enhancing metastases are identified today.

As before, the largest is in the posterior right temporal
lobe/anterior occipital lobe with a fungating appearance measuring
nearly 5 cm in diameter. There are 6 punctate enhancing metastases
today.

There might be a tenth subcentimeter metastasis in the lateral left
occipital lobe seen only on coronal series 8, image 11.
Alternatively this could be a normal vascular structure adjacent to
the sigmoid sinus. Attention directed on followup.

Heterogeneous bone marrow signal is stable. No destructive osseous
lesion identified. Grossly negative visible cervical spinal cord.
IMPRESSION: 1. Nine enhancing metastases identified, and all annotated on series
[DATE]. Associated cerebral edema and intracranial mass effect are stable
since 04/14/2014.
[DATE]. Possible small tenth left occipital lobe metastasis seen only on
series 8, image 11. Attention directed on followup.
